# Patient Record
Sex: Female | Born: 1955 | Race: Black or African American | Hispanic: No | Marital: Single | Smoking: Never smoker
Health system: Southern US, Community
[De-identification: ages and names within clinical notes are randomized; demographics above are authoritative.]

## PROBLEM LIST (undated history)

## (undated) DIAGNOSIS — C50919 Malignant neoplasm of unspecified site of unspecified female breast: Secondary | ICD-10-CM

## (undated) HISTORY — PX: COLONOSCOPY: SHX5424

---

## 1996-04-17 HISTORY — PX: ABDOMINAL HYSTERECTOMY: SHX81

## 2016-12-14 ENCOUNTER — Other Ambulatory Visit: Payer: Self-pay | Admitting: Family Medicine

## 2016-12-14 DIAGNOSIS — N63 Unspecified lump in unspecified breast: Secondary | ICD-10-CM

## 2016-12-20 ENCOUNTER — Ambulatory Visit
Admission: RE | Admit: 2016-12-20 | Discharge: 2016-12-20 | Disposition: A | Discharge status: Home / Self Care | Payer: PRIVATE HEALTH INSURANCE | Source: Ambulatory Visit | Attending: Family Medicine | Admitting: Family Medicine

## 2016-12-20 ENCOUNTER — Other Ambulatory Visit: Payer: Self-pay | Admitting: Family Medicine

## 2016-12-20 DIAGNOSIS — N63 Unspecified lump in unspecified breast: Secondary | ICD-10-CM

## 2016-12-22 ENCOUNTER — Ambulatory Visit
Admission: RE | Admit: 2016-12-22 | Discharge: 2016-12-22 | Disposition: A | Discharge status: Home / Self Care | Payer: PRIVATE HEALTH INSURANCE | Source: Ambulatory Visit | Attending: Family Medicine | Admitting: Family Medicine

## 2016-12-22 ENCOUNTER — Other Ambulatory Visit: Payer: Self-pay | Admitting: Family Medicine

## 2016-12-22 DIAGNOSIS — N63 Unspecified lump in unspecified breast: Secondary | ICD-10-CM

## 2016-12-26 ENCOUNTER — Telehealth: Payer: Self-pay | Admitting: Hematology and Oncology

## 2016-12-26 ENCOUNTER — Encounter: Payer: Self-pay | Admitting: *Deleted

## 2016-12-26 NOTE — Telephone Encounter (Signed)
Confirmed with patient [name] appointment for 01/03/17 in the afternoon, patient wanted intake form to be mailed to her

## 2017-01-02 ENCOUNTER — Other Ambulatory Visit: Payer: Self-pay | Admitting: *Deleted

## 2017-01-02 ENCOUNTER — Encounter: Payer: Self-pay | Admitting: *Deleted

## 2017-01-02 DIAGNOSIS — Z170 Estrogen receptor positive status [ER+]: Principal | ICD-10-CM

## 2017-01-02 DIAGNOSIS — C50411 Malignant neoplasm of upper-outer quadrant of right female breast: Secondary | ICD-10-CM

## 2017-01-03 ENCOUNTER — Encounter: Payer: Self-pay | Admitting: General Practice

## 2017-01-03 ENCOUNTER — Encounter: Payer: Self-pay | Admitting: Radiation Oncology

## 2017-01-03 ENCOUNTER — Ambulatory Visit
Admission: RE | Admit: 2017-01-03 | Discharge: 2017-01-03 | Disposition: A | Discharge status: Home / Self Care | Payer: No Typology Code available for payment source | Source: Ambulatory Visit | Attending: Radiation Oncology | Admitting: Radiation Oncology

## 2017-01-03 ENCOUNTER — Ambulatory Visit: Payer: No Typology Code available for payment source | Attending: General Surgery | Admitting: Physical Therapy

## 2017-01-03 ENCOUNTER — Other Ambulatory Visit (HOSPITAL_BASED_OUTPATIENT_CLINIC_OR_DEPARTMENT_OTHER): Payer: PRIVATE HEALTH INSURANCE

## 2017-01-03 ENCOUNTER — Other Ambulatory Visit: Payer: Self-pay | Admitting: General Surgery

## 2017-01-03 ENCOUNTER — Encounter: Payer: Self-pay | Admitting: Hematology and Oncology

## 2017-01-03 ENCOUNTER — Ambulatory Visit (HOSPITAL_BASED_OUTPATIENT_CLINIC_OR_DEPARTMENT_OTHER): Payer: PRIVATE HEALTH INSURANCE | Admitting: Hematology and Oncology

## 2017-01-03 ENCOUNTER — Encounter: Payer: Self-pay | Admitting: Physical Therapy

## 2017-01-03 DIAGNOSIS — R293 Abnormal posture: Secondary | ICD-10-CM

## 2017-01-03 DIAGNOSIS — C50411 Malignant neoplasm of upper-outer quadrant of right female breast: Secondary | ICD-10-CM | POA: Insufficient documentation

## 2017-01-03 DIAGNOSIS — Z885 Allergy status to narcotic agent status: Secondary | ICD-10-CM | POA: Insufficient documentation

## 2017-01-03 DIAGNOSIS — Z170 Estrogen receptor positive status [ER+]: Principal | ICD-10-CM

## 2017-01-03 DIAGNOSIS — Z9889 Other specified postprocedural states: Secondary | ICD-10-CM | POA: Insufficient documentation

## 2017-01-03 DIAGNOSIS — Z90710 Acquired absence of both cervix and uterus: Secondary | ICD-10-CM | POA: Insufficient documentation

## 2017-01-03 DIAGNOSIS — Z510 Encounter for antineoplastic radiation therapy: Secondary | ICD-10-CM | POA: Insufficient documentation

## 2017-01-03 LAB — COMPREHENSIVE METABOLIC PANEL WITH GFR
ALT: 16 U/L (ref 0–55)
AST: 22 U/L (ref 5–34)
Albumin: 3.8 g/dL (ref 3.5–5.0)
Alkaline Phosphatase: 69 U/L (ref 40–150)
Anion Gap: 10 meq/L (ref 3–11)
BUN: 14.5 mg/dL (ref 7.0–26.0)
CO2: 23 meq/L (ref 22–29)
Calcium: 9.7 mg/dL (ref 8.4–10.4)
Chloride: 106 meq/L (ref 98–109)
Creatinine: 0.8 mg/dL (ref 0.6–1.1)
EGFR: >90 ml/min/1.73 m2
Glucose: 105 mg/dL (ref 70–140)
Potassium: 4.2 meq/L (ref 3.5–5.1)
Sodium: 139 meq/L (ref 136–145)
Total Bilirubin: 1.32 mg/dL — ABNORMAL HIGH (ref 0.20–1.20)
Total Protein: 7.3 g/dL (ref 6.4–8.3)

## 2017-01-03 LAB — CBC WITH DIFFERENTIAL/PLATELET
BASO%: 0.3 % (ref 0.0–2.0)
Basophils Absolute: 0 10e3/uL (ref 0.0–0.1)
EOS%: 9 % — ABNORMAL HIGH (ref 0.0–7.0)
Eosinophils Absolute: 0.4 10e3/uL (ref 0.0–0.5)
HCT: 40.6 % (ref 34.8–46.6)
HGB: 13.8 g/dL (ref 11.6–15.9)
LYMPH%: 48.2 % (ref 14.0–49.7)
MCH: 29.3 pg (ref 25.1–34.0)
MCHC: 34 g/dL (ref 31.5–36.0)
MCV: 86.2 fL (ref 79.5–101.0)
MONO#: 0.3 10e3/uL (ref 0.1–0.9)
MONO%: 6.7 % (ref 0.0–14.0)
NEUT#: 1.4 10e3/uL — ABNORMAL LOW (ref 1.5–6.5)
NEUT%: 35.8 % — ABNORMAL LOW (ref 38.4–76.8)
Platelets: 151 10e3/uL (ref 145–400)
RBC: 4.71 10e6/uL (ref 3.70–5.45)
RDW: 13.6 % (ref 11.2–14.5)
WBC: 3.9 10e3/uL (ref 3.9–10.3)
lymph#: 1.9 10e3/uL (ref 0.9–3.3)
nRBC: 0 % (ref 0–0)

## 2017-01-03 NOTE — Progress Notes (Signed)
Nutrition Assessment  Reason for Assessment:  Pt seen in Breast Clinic  ASSESSMENT:   61 year old female with new diagnosis breast cancer.  Past medical history reviewed.    Patient reports good appetite/normal.   Medications:  reviewed  Labs: reviewed  Anthropometrics:   Height: 66 inches Weight: 149 lb BMI: 28.2   NUTRITION DIAGNOSIS: Food and nutrition related knowledge deficit related to new diagnosis of breast cancer as evidenced by no prior need for nutrition related information.  INTERVENTION:   Discussed and provided packet of information regarding nutritional tips for breast cancer patients.  Questions answered.  Teachback method used.  Contact information provided and patient knows to contact me with questions/concerns.    MONITORING, EVALUATION, and GOAL: Pt will consume a healthy plant based diet to maintain lean body mass throughout treatment.   Aleksandra Raben B. Zenia Resides, [place], [place] Registered Dietitian [phone] (pager)

## 2017-01-03 NOTE — Therapy (Signed)
[place], [name], [address] Phone: [phone]   Fax:  [phone]  Physical Therapy Evaluation  Patient Details  Name: Brenda Hartman MRN: [id number] Date of Birth: 07/22/1955 Referring Provider: Dr. Stark Klein  Encounter Date: 01/03/2017      PT End of Session - 01/03/17 2040    Visit Number 1   Number of Visits 2   Date for PT Re-Evaluation --  Will f/u 3 weeks post op   PT Start Time 2376   PT Stop Time 1414   PT Time Calculation (min) 21 min   Activity Tolerance Patient tolerated treatment well   Behavior During Therapy [facility] for tasks assessed/performed      History reviewed. No pertinent past medical history.  Past Surgical History:  Procedure Laterality Date  . ABDOMINAL HYSTERECTOMY  1998   due to fibroids    There were no vitals filed for this visit.       Subjective Assessment - 01/03/17 2031    Subjective Patient reports she is here today to be seen by her medical team for her newly diagnosed right breast cancer.   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 It measures 2.8 cm and is located in the upper outer quadrant with a Ki67 of 10%. It is ER/PR positive and HER2 negative.   Patient Stated Goals Reduce lymphedema risk and learn post op shoulder ROM HEP   Currently in Pain? No/denies            [facility] PT Assessment - 01/03/17 0001      Assessment   Medical Diagnosis Right breast cancer   Referring Provider Dr. Stark Klein   Onset Date/Surgical Date 12/20/16   Hand Dominance Right   Prior Therapy none     Precautions   Precautions Other (comment)   Precaution Comments active cancer     Restrictions   Weight Bearing Restrictions No     Balance Screen   Has the patient fallen in the past 6 months No   Has the patient had a decrease in activity level because of a fear of falling?  No   Is  the patient reluctant to leave their home because of a fear of falling?  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 [place] She walks 30 min/day     Cognition   Overall Cognitive Status Within Functional Limits for tasks assessed     Posture/Postural Control   Posture/Postural Control Postural limitations   Postural Limitations Forward head;Rounded Shoulders     ROM / Strength   AROM / PROM / Strength AROM;Strength     AROM   AROM Assessment Site Shoulder;Cervical   Right/Left Shoulder Right;Left   Right Shoulder Extension 43 Degrees   Right Shoulder Flexion 146 Degrees   Right Shoulder ABduction 165 Degrees   Right Shoulder Internal Rotation 73 Degrees   Right Shoulder External Rotation 90 Degrees   Left Shoulder Extension 55 Degrees   Left Shoulder Flexion 154 Degrees   Left Shoulder ABduction 162 Degrees   Left Shoulder Internal Rotation 66 Degrees   Left Shoulder External Rotation 90  Degrees   Cervical Flexion WNL   Cervical Extension WNL   Cervical - Right Side Bend WNL   Cervical - Left Side Bend WNL   Cervical - Right Rotation WNL   Cervical - Left Rotation WNL     Strength   Overall Strength Within functional limits for tasks performed           LYMPHEDEMA/ONCOLOGY QUESTIONNAIRE - 01/03/17 2035      Type   Cancer Type Right breast cancer     Lymphedema Assessments   Lymphedema Assessments Upper extremities     Right Upper Extremity Lymphedema   10 cm Proximal to Olecranon Process 31.8 cm   Olecranon Process 25.3 cm   10 cm Proximal to Ulnar Styloid Process 21.9 cm   Just Proximal to Ulnar Styloid Process 16.2 cm   Across Hand at [employer] 19.6 cm   At [place] of 2nd Digit 6.1 cm      Left Upper Extremity Lymphedema   10 cm Proximal to Olecranon Process 31.8 cm   Olecranon Process 25.6 cm   10 cm Proximal to Ulnar Styloid Process 21.2 cm   Just Proximal to Ulnar Styloid Process 15.9 cm   Across Hand at [employer] 18.8 cm   At [place] of 2nd Digit 6 cm         Objective measurements completed on examination: See above findings.     Patient was instructed today in a home exercise program today for post op shoulder range of motion. These included active assist shoulder flexion in sitting, scapular retraction, wall walking with shoulder abduction, and hands behind head external rotation.  She was encouraged to do these twice a day, holding 3 seconds and repeating 5 times when permitted by her physician.         PT Education - 01/03/17 2039    Education provided Yes   Education Details Lymphedema risk reduction and post op shoulder ROM HEP   Person(s) Educated Patient   Methods Explanation;Demonstration;Handout   Comprehension Returned demonstration;Verbalized understanding              Breast Clinic Goals - 01/03/17 2044      Patient will be able to verbalize understanding of pertinent lymphedema risk reduction practices relevant to her diagnosis specifically related to skin care.   Time 1   Period Days   Status Achieved     Patient will be able to return demonstrate and/or verbalize understanding of the post-op home exercise program related to regaining shoulder range of motion.   Time 1   Period Days   Status Achieved     Patient will be able to verbalize understanding of the importance of attending the postoperative After Breast Cancer Class for further lymphedema risk reduction education and therapeutic exercise.   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 It measures  2.8 cm and is located in the upper outer quadrant with a Ki67 of 10%. It is ER/PR positive and HER2 negative. Her multidisciplinary medical team met prior to her assessments to determine a recommended treatment plan. She is planning to have a right lumpectomy and sentinel node biopsy followed by Oncotype testing, radiation, and anti-estrogen therapy. She may benefit from post op PT to regain shoulder  ROM and reduce lymphedema risk.   History and Personal Factors relevant to plan of care: Lives alone   Clinical Presentation Stable   Clinical Decision Making Low   Rehab Potential Excellent   Clinical Impairments Affecting Rehab Potential None   PT Frequency One time visit  Followed by a f/u visit 3 weeks post op   PT Treatment/Interventions Patient/family education;Therapeutic exercise   PT Next Visit Plan Will f.u 3 weeks post op to reassess and determine PT needs   PT Home Exercise Plan Post op shoulder ROM HEP   Consulted and Agree with Plan of Care Patient      Patient will benefit from skilled therapeutic intervention in order to improve the following deficits and impairments:  Postural dysfunction, Decreased knowledge of precautions, Pain, Impaired UE functional use, Decreased range of motion  Visit Diagnosis: Carcinoma of upper-outer quadrant of right breast in female, estrogen receptor positive ([place]) - Plan: PT plan of care cert/re-cert  Abnormal posture - Plan: PT plan of care cert/re-cert   Patient will follow up at outpatient cancer rehab if needed following surgery.  If the patient requires physical therapy at that time, a specific plan will be dictated and sent to the referring physician for approval. The patient was educated today on appropriate basic range of motion exercises to begin post operatively and the importance of attending the After Breast Cancer class following surgery.  Patient was educated today on lymphedema risk reduction practices as it pertains to recommendations  that will benefit the patient immediately following surgery.  She verbalized good understanding.  No additional physical therapy is indicated at this time.      Problem List Patient Active Problem List   Diagnosis Date Noted  . Malignant neoplasm of upper-outer quadrant of right breast in female, estrogen receptor positive ([place]) 01/02/2017    Annia Friendly, PT 01/03/17 8:47 PM  [place], [name], [address] Phone: [phone]   Fax:  [phone]  Name: Brenda Hartman MRN: [id number] Date of Birth: 10-27-1955

## 2017-01-03 NOTE — Addendum Note (Signed)
Encounter addended by: Hayden Pedro, PA-C on: 01/03/2017  2:20 PM<BR>    Actions taken: Sign clinical note

## 2017-01-03 NOTE — Progress Notes (Signed)
Radiation Oncology         ([phone]) [phone] ________________________________  Name: Brenda Hartman        MRN: [id number]  Date of Service: 01/03/2017 DOB: Jul 11, 1955  EY:CXKGY, Mechele Claude, MD  Stark Klein, MD     REFERRING PHYSICIAN: Stark Klein, MD   DIAGNOSIS: The encounter diagnosis was Malignant neoplasm of upper-outer quadrant of right breast in female, estrogen receptor positive ([place]).   HISTORY OF PRESENT ILLNESS: Brenda Hartman is a 61 y.o. female seen in the multidisciplinary breast clinic for a new diagnosis of a right  breast cancer. The patient was noted to have a palpable mass in the right breast. Diagnostic imaging revealed a 2.8 x 2.4 x 1.7 cm mass in the 9:30 position of the right breast, and her axilla was negative for adenopathy by ultrasound. A biopsy on 12/22/16 of her mass revealed a grade 1 ER/PR positive, HER2 negative invasive ductal carcinoma with DCIS and her Ki 67 was 10%. She comes today to discuss options of radiotherapy.   PREVIOUS RADIATION THERAPY: No   PAST MEDICAL HISTORY: No past medical history on file.     PAST SURGICAL HISTORY: Past Surgical History:  Procedure Laterality Date  . ABDOMINAL HYSTERECTOMY  1998   due to fibroids     FAMILY HISTORY:  Family History  Problem Relation Age of Onset  . Cancer Neg Hx      SOCIAL HISTORY:  reports that she has never smoked. She has never used smokeless tobacco. She reports that she does not drink alcohol or use drugs. The patient lives in [place]. She is single. 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   ALLERGIES: Morphine and related   MEDICATIONS:  No current outpatient prescriptions on file.   No current facility-administered medications for this encounter.      REVIEW OF SYSTEMS: On review of systems, the patient reports that she is doing well overall. She denies any chest pain, shortness of breath, cough, fevers, chills, night sweats, unintended weight changes. She denies  any bowel or bladder disturbances, and denies abdominal pain, nausea or vomiting. She denies any new musculoskeletal or joint aches or pains. A complete review of systems is obtained and is otherwise negative.     PHYSICAL EXAM:  Wt Readings from Last 3 Encounters:  01/03/17 181 lb 4.8 oz (82.2 kg)   Temp Readings from Last 3 Encounters:  01/03/17 97.7 F (36.5 C) (Oral)   BP Readings from Last 3 Encounters:  01/03/17 140/90   Pulse Readings from Last 3 Encounters:  01/03/17 70     In general this is a well appearing African American  female in no acute distress. She is alert and oriented x4 and appropriate throughout the examination. HEENT reveals that the patient is normocephalic, atraumatic. EOMs are intact. PERRLA. Skin is intact without any evidence of gross lesions. Cardiovascular exam reveals a regular rate and rhythm, no clicks rubs or murmurs are auscultated. Chest is clear to auscultation bilaterally. Lymphatic assessment is performed and does not reveal any adenopathy in the cervical, supraclavicular, axillary, or inguinal chains. Bilateral breast exam is performed and reveals fullness in the right breast without nipple discharge or bleeding. The left breast is negative for palpable mass.  Abdomen has active bowel sounds in all quadrants and is intact. The abdomen is soft, non tender, non distended. Lower extremities are negative for pretibial pitting edema, deep calf tenderness, cyanosis or clubbing.   ECOG = 1  0 - Asymptomatic (  Fully active, able to carry on all predisease activities without restriction)  1 - Symptomatic but completely ambulatory (Restricted in physically strenuous activity but ambulatory and able to carry out work of a light or sedentary nature. For example, light housework, office work)  2 - Symptomatic, <50% in bed during the day (Ambulatory and capable of all self care but unable to carry out any work activities. Up and about more than 50% of waking  hours)  3 - Symptomatic, >50% in bed, but not bedbound (Capable of only limited self-care, confined to bed or chair 50% or more of waking hours)  4 - Bedbound (Completely disabled. Cannot carry on any self-care. Totally confined to bed or chair)  5 - Death   Eustace Pen MM, Creech RH, Tormey DC, et al. [phone]). "Toxicity and response criteria of the [facility]". [place] Oncol. 5 (6): 649-55    LABORATORY DATA:  Lab Results  Component Value Date   WBC 3.9 01/03/2017   HGB 13.8 01/03/2017   HCT 40.6 01/03/2017   MCV 86.2 01/03/2017   PLT 151 01/03/2017   Lab Results  Component Value Date   NA 139 01/03/2017   K 4.2 01/03/2017   CO2 23 01/03/2017   Lab Results  Component Value Date   ALT 16 01/03/2017   AST 22 01/03/2017   ALKPHOS 69 01/03/2017   BILITOT 1.32 (H) 01/03/2017      RADIOGRAPHY: US Breast Ltd Uni Right Inc Axilla  Result Date: 12/22/2016 CLINICAL DATA:  Right breast upper outer quadrant mass. EXAM: ULTRASOUND OF THE RIGHT BREAST COMPARISON:  Previous exam(s). FINDINGS: Static images from targeted right breast ultrasound demonstrates 930 o'clock 4 cm from the nipple solid hypoechoic round mass with increased internal vascularity. It measures 2.4 x 1.7 x 2.8 cm. IMPRESSION: Suspicious right breast 930 o'clock mass. RECOMMENDATION: Ultrasound-guided core needle biopsy of the right breast. I have discussed the findings and recommendations with the patient. Results were also provided in writing at the conclusion of the visit. If applicable, a reminder letter will be sent to the patient regarding the next appointment. BI-RADS CATEGORY  4: Suspicious. Electronically Signed   By: Fidela Salisbury M.D.   On: 12/22/2016 11:23   Mm Diag Breast Tomo Bilateral  Result Date: 12/20/2016 CLINICAL DATA:  Patient complains of a palpable mass in the right breast. EXAM: 2D DIGITAL DIAGNOSTIC BILATERAL MAMMOGRAM WITH CAD AND ADJUNCT TOMO ULTRASOUND RIGHT BREAST  COMPARISON:  Previous exam(s). ACR Breast Density Category b: There are scattered areas of fibroglandular density. FINDINGS: There is a 2.6 cm mass in the upper-outer quadrant of the right breast. It is associated with punctate calcifications. No suspicious mass or malignant type microcalcifications identified in the left breast. Mammographic images were processed with CAD. On physical exam, I palpate a discrete mass in the right breast at 9:30 4 cm from the nipple. Targeted ultrasound is performed, showing a well-circumscribed hypoechoic mass in the right breast 9:30 4 cm from the nipple measuring 2.4 x 1.7 x 2.8 cm. Sonographic evaluation of the right axilla does not show any enlarged adenopathy. IMPRESSION: Suspicious right breast mass. RECOMMENDATION: Ultrasound-guided core biopsy of the right breast mass is recommended. The biopsy will be scheduled at the patient's convenience. I have discussed the findings and recommendations with the patient. Results were also provided in writing at the conclusion of the visit. If applicable, a reminder letter will be sent to the patient regarding the next appointment. BI-RADS CATEGORY  5: Highly  suggestive of malignancy. Electronically Signed   By: Lillia Mountain M.D.   On: 12/20/2016 15:14   Mm Clip Placement Right  Result Date: 12/22/2016 CLINICAL DATA:  Post ultrasound-guided core needle biopsy of right 930 o'clock breast mass. EXAM: DIAGNOSTIC RIGHT MAMMOGRAM POST ULTRASOUND BIOPSY COMPARISON:  Previous exam(s). FINDINGS: Mammographic images were obtained following ultra guided biopsy of right 930 o'clock breast mass. Two-view mammography demonstrates presence of a ribbon shaped marker within the biopsied mass. Expected post biopsy changes are seen. IMPRESSION: Successful placement of ribbon shaped marker, post ultrasound-guided core needle biopsy of the right breast. Final Assessment: Post Procedure Mammograms for Marker Placement Electronically Signed   By: Fidela Salisbury M.D.   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 This was found to be concordant by Dr. Fidela Salisbury. Pathology results were discussed with the patient by telephone. The patient reported doing well after the biopsy. Post biopsy instructions and care were reviewed and questions were answered. The patient was encouraged to call [facility] for any additional concerns. The patient was referred to the [facility] at the [facility] on January 03, 2017. Pathology results reported by Susa Raring RN, BSN on 12/25/2016. Electronically Signed   By: Fidela Salisbury M.D.   On: 12/25/2016 10:17   Result Date: 12/25/2016 CLINICAL DATA:  Right breast 930 o'clock mass. EXAM: ULTRASOUND GUIDED RIGHT BREAST CORE NEEDLE BIOPSY COMPARISON:  Previous exam(s). FINDINGS: I met with the patient and we discussed the procedure of ultrasound-guided biopsy, including benefits and alternatives. We discussed the high likelihood of a successful procedure. We discussed the risks of the procedure, including infection, bleeding, tissue injury, clip migration, and inadequate sampling. Informed written consent was given. The usual time-out protocol was performed immediately prior to the procedure. Lesion quadrant: Upper outer quadrant. Using sterile technique and 1% Lidocaine as local anesthetic, under direct ultrasound visualization, a 14 gauge spring-loaded device was used to perform biopsy of right breast 930 o'clock mass using a inferior approach. At the conclusion of the procedure a ribbon shaped tissue marker clip was deployed into the biopsy cavity. Follow up 2 view mammogram was performed and dictated separately. IMPRESSION:  Ultrasound guided biopsy of right breast. No apparent complications. Electronically Signed: By: Fidela Salisbury M.D. On: 12/22/2016 11:19       IMPRESSION/PLAN: 1. Stage IB, cT2N0Mx grade 1 ER/PR positive invasive ductal carcinoma of the right breast. Dr. Lisbeth Renshaw discusses the pathology findings and reviews the nature of invasive ductal  disease. The consensus from the breast conference included  breast conservation with lumpectomy with sentinel mapping or mastectomy with sentinel node evaluatoin. Oncotype testing will be performed to determine a role for chemotherapy. Provided that chemotherapy is not indicated, the patient's course would then be followed by external radiotherapy to the breast if she undergoes lumpectomy followed by antiestrogen therapy. We discussed the risks, benefits, short, and long term effects of radiotherapy, and the patient is interested in proceeding as she is leaning toward lumpectomy. Dr. Lisbeth Renshaw discusses the delivery and logistics of radiotherapy and would anticipate a 4 week course. We will see her back about 2 weeks postoperatively if she undergoes lumpectomy to move forward with the simulation and planning process and anticipate  starting radiotherapy about 4 weeks after surgery.    The above documentation reflects my direct findings during this shared patient visit. Please see the separate note by Dr. Lisbeth Renshaw on this date for the remainder of the patient's plan of care.    Carola Rhine, PAC

## 2017-01-03 NOTE — Patient Instructions (Signed)

## 2017-01-04 ENCOUNTER — Encounter: Payer: Self-pay | Admitting: Hematology and Oncology

## 2017-01-04 NOTE — Progress Notes (Signed)
[facility] Psychosocial Distress Screening Spiritual Care  Met with Jaima in [facility] to introduce [facility] team/resources, reviewing distress screen per protocol.  The patient scored a 1 on the Psychosocial Distress Thermometer which indicates mild distress. Also assessed for distress and other psychosocial needs.   ONCBCN DISTRESS SCREENING 01/04/2017  Screening Type Initial Screening  Distress experienced in past week (1-10) 1  Emotional problem type Nervousness/Anxiety  Physical Problem type Skin dry/itchy  Referral to support programs Yes    Per pt, Snow moved here from [place] two years ago for her own self-care and new adventure, and she loves it.  She has been hoping to retire in the next couple of years.  Prayer and faith help keep her calm and centered.  She reports meaningful connection with church.   Follow up needed: No.  At this time, pt has no other needs or concerns.  She has full packet of [place] and knows to contact Team with any needs, questions, or interests.   [place], [place], [facility] Pager [phone] Voicemail [phone]

## 2017-01-04 NOTE — Progress Notes (Signed)
Received staff message from Lisa(chaplain) to contact patient regarding insurnce/assistance.  Reviewed chart and no treatment plan present, surgery first.  Called patient to introduce myself as Arboriculturist. Advised her of my role and how we assist patients in chemotherapy. Asked patient if she would be receiving chemotherapy and she said no she is having surgery and her insurance isn't great. Advised patient that any assistance with surgery would have to be handled with the surgeon's office and the pre-service center who would call about surgery cost. Also explained if any assistance is available once the bill is sent, she would contact the billing department on the bill to discuss.  If chemotherapy becomes a part of her treatment plan, I would meet with her to discuss available options related to chemo treatment.

## 2017-01-04 NOTE — Progress Notes (Signed)
[facility] NOTE  Patient Care Team: Fanny Bien, MD as PCP - General (Family Medicine) Stark Klein, MD as Consulting Physician (General Surgery) Nicholas Lose, MD as Consulting Physician (Hematology and Oncology) Kyung Rudd, MD as Consulting Physician (Radiation Oncology)  CHIEF COMPLAINTS/PURPOSE OF CONSULTATION:  Newly diagnosed breast cancer  HISTORY OF PRESENTING ILLNESS:  Brenda Hartman 61 y.o. female is here because of recent diagnosis of right breast cancer. Patient felt a lump in the right breast in the upper outer quadrant at 9:30 position. Mammogram and ultrasound revealed a 2.8 cm mass. Axilla was negative. 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 The tumor was ER/PR positive HER-2 negative with a Ki-67 of 10%. She was presented this morning in the multidisciplinary tumor board and she is here today to discuss entire treatment plan.  I reviewed her records extensively and collaborated the history with the patient.  SUMMARY OF ONCOLOGIC HISTORY:   Malignant neoplasm of upper-outer quadrant of right breast in female, estrogen receptor positive ([place])   12/22/2016 Initial Diagnosis    Palpable right breast mass UOQ at 9:30 position measured 2.8 cm axilla negative, ultrasound biopsy showed grade 1 IDC with DCIS, ER 100%, PR 100%, Ki-67 10%, HER-2 negative ratio 1.06, T2 N0 stage IB AJCC 8       MEDICAL HISTORY:  History reviewed. No pertinent past medical history.  SURGICAL HISTORY: Past Surgical History:  Procedure Laterality Date  . ABDOMINAL HYSTERECTOMY  1998   due to fibroids    SOCIAL HISTORY: Social History   Social History  . Marital status: Single    Spouse name: N/A  . Number of children: N/A  . Years of education: N/A   Occupational History  . Not on file.   Social History Main Topics  . Smoking status: Never Smoker  . Smokeless tobacco: Never Used  . Alcohol use No  . Drug use:  No  . Sexual activity: Not on file   Other Topics Concern  . Not on file   Social History Narrative  . No narrative on file    FAMILY HISTORY: Family History  Problem Relation Age of Onset  . Cancer Neg Hx     ALLERGIES:  is allergic to morphine and related.  MEDICATIONS:  No current outpatient prescriptions on file.   No current facility-administered medications for this visit.     REVIEW OF SYSTEMS:   Constitutional: Denies fevers, chills or abnormal night sweats Eyes: Denies blurriness of vision, double vision or watery eyes Ears, nose, mouth, throat, and face: Denies mucositis or sore throat Respiratory: Denies cough, dyspnea or wheezes Cardiovascular: Denies palpitation, chest discomfort or lower extremity swelling Gastrointestinal:  Denies nausea, heartburn or change in bowel habits Skin: Denies abnormal skin rashes Lymphatics: Denies new lymphadenopathy or easy bruising Neurological:Denies numbness, tingling or new weaknesses Behavioral/Psych: Mood is stable, no new changes  Breast:  Palpable lump in the right breast All other systems were reviewed with the patient and are negative.  PHYSICAL EXAMINATION: ECOG PERFORMANCE STATUS: 1 - Symptomatic but completely ambulatory  Vitals:   01/03/17 1248  BP: 140/90  Pulse: 70  Resp: 18  Temp: 97.7 F (36.5 C)  SpO2: 99%   Filed Weights   01/03/17 1248  Weight: 181 lb 4.8 oz (82.2 kg)    GENERAL:alert, no distress and comfortable SKIN: skin color, texture, turgor are normal, no rashes or significant lesions EYES: normal, conjunctiva are pink and non-injected, sclera clear OROPHARYNX:no exudate,  no erythema and lips, buccal mucosa, and tongue normal  NECK: supple, thyroid normal size, non-tender, without nodularity LYMPH:  no palpable lymphadenopathy in the cervical, axillary or inguinal LUNGS: clear to auscultation and percussion with normal breathing effort HEART: regular rate & rhythm and no murmurs and  no lower extremity edema ABDOMEN:abdomen soft, non-tender and normal bowel sounds Musculoskeletal:no cyanosis of digits and no clubbing  PSYCH: alert & oriented x 3 with fluent speech NEURO: no focal motor/sensory deficits BREAST: Palpable lump in the right breast. No palpable axillary or supraclavicular lymphadenopathy (exam performed in the presence of a chaperone)   LABORATORY DATA:  I have reviewed the data as listed Lab Results  Component Value Date   WBC 3.9 01/03/2017   HGB 13.8 01/03/2017   HCT 40.6 01/03/2017   MCV 86.2 01/03/2017   PLT 151 01/03/2017   Lab Results  Component Value Date   NA 139 01/03/2017   K 4.2 01/03/2017   CO2 23 01/03/2017    RADIOGRAPHIC STUDIES: I have personally reviewed the radiological reports and agreed with the findings in the report.  ASSESSMENT AND PLAN:  Malignant neoplasm of upper-outer quadrant of right breast in female, estrogen receptor positive ([place]) 12/22/2016: Palpable right breast mass UOQ at 9:30 position measured 2.8 cm axilla negative, ultrasound biopsy showed grade 1 IDC with DCIS, ER 100%, PR 100%, Ki-67 10%, HER-2 negative ratio 1.06, T2 N0 stage IB AJCC 8  Pathology and radiology counseling:Discussed with the patient, the details of pathology including the type of breast cancer,the clinical staging, the significance of ER, PR and HER-2/neu receptors and the implications for treatment. After reviewing the pathology in detail, we proceeded to discuss the different treatment options between surgery, radiation, chemotherapy, antiestrogen therapies.  Recommendations: 1. Breast conserving surgery with SLN biopsy followed by 2. Oncotype DX testing to determine if chemotherapy would be of any benefit followed by 3. Adjuvant radiation therapy followed by 4. Adjuvant antiestrogen therapy  Oncotype counseling: I discussed Oncotype DX test. I explained to the patient that this is a 21 gene panel to evaluate patient tumors DNA to  calculate recurrence score. This would help determine whether patient has high risk or intermediate risk or low risk breast cancer. She understands that if her tumor was found to be high risk, she would benefit from systemic chemotherapy. If low risk, no need of chemotherapy. If she was found to be intermediate risk, we would need to evaluate the score as well as other risk factors and determine if an abbreviated chemotherapy may be of benefit.  Return to clinic after surgery to discuss final pathology report and then determine if Oncotype DX testing will need to be sent.   All questions were answered. The patient knows to call the clinic with any problems, questions or concerns.    Rulon Eisenmenger, MD 01/04/17

## 2017-01-04 NOTE — Assessment & Plan Note (Signed)
12/22/2016: Palpable right breast mass UOQ at 9:30 position measured 2.8 cm axilla negative, ultrasound biopsy showed grade 1 IDC with DCIS, ER 100%, PR 100%, Ki-67 10%, HER-2 negative ratio 1.06, T2 N0 stage IB AJCC 8  Pathology and radiology counseling:Discussed with the patient, the details of pathology including the type of breast cancer,the clinical staging, the significance of ER, PR and HER-2/neu receptors and the implications for treatment. After reviewing the pathology in detail, we proceeded to discuss the different treatment options between surgery, radiation, chemotherapy, antiestrogen therapies.  Recommendations: 1. Breast conserving surgery with SLN biopsy followed by 2. Oncotype DX testing to determine if chemotherapy would be of any benefit followed by 3. Adjuvant radiation therapy followed by 4. Adjuvant antiestrogen therapy  Oncotype counseling: I discussed Oncotype DX test. I explained to the patient that this is a 21 gene panel to evaluate patient tumors DNA to calculate recurrence score. This would help determine whether patient has high risk or intermediate risk or low risk breast cancer. She understands that if her tumor was found to be high risk, she would benefit from systemic chemotherapy. If low risk, no need of chemotherapy. If she was found to be intermediate risk, we would need to evaluate the score as well as other risk factors and determine if an abbreviated chemotherapy may be of benefit.  Return to clinic after surgery to discuss final pathology report and then determine if Oncotype DX testing will need to be sent.

## 2017-01-05 ENCOUNTER — Other Ambulatory Visit: Payer: Self-pay | Admitting: General Surgery

## 2017-01-05 DIAGNOSIS — C50411 Malignant neoplasm of upper-outer quadrant of right female breast: Secondary | ICD-10-CM

## 2017-01-05 DIAGNOSIS — Z170 Estrogen receptor positive status [ER+]: Principal | ICD-10-CM

## 2017-01-09 ENCOUNTER — Telehealth: Payer: Self-pay | Admitting: *Deleted

## 2017-01-09 NOTE — Telephone Encounter (Signed)
Called pt to discuss [place] from 9.19.18. Unable to leave msg will call back.

## 2017-01-10 ENCOUNTER — Telehealth: Payer: Self-pay | Admitting: Hematology and Oncology

## 2017-01-10 NOTE — Telephone Encounter (Signed)
Scheduled appts per 9/25 sch msg. Left voicemail for the patient regarding the appts and am sending her a confirmation letter in the mail .

## 2017-01-16 ENCOUNTER — Encounter (HOSPITAL_COMMUNITY)
Admission: RE | Admit: 2017-01-16 | Discharge: 2017-01-16 | Disposition: A | Discharge status: Home / Self Care | Payer: No Typology Code available for payment source | Source: Ambulatory Visit | Attending: General Surgery | Admitting: General Surgery

## 2017-01-16 ENCOUNTER — Encounter (HOSPITAL_COMMUNITY): Payer: Self-pay | Admitting: *Deleted

## 2017-01-16 DIAGNOSIS — Z01818 Encounter for other preprocedural examination: Secondary | ICD-10-CM | POA: Diagnosis not present

## 2017-01-16 LAB — CBC
HEMATOCRIT: 41.2 % (ref 36.0–46.0)
HEMOGLOBIN: 13.9 g/dL (ref 12.0–15.0)
MCH: 28.5 pg (ref 26.0–34.0)
MCHC: 33.7 g/dL (ref 30.0–36.0)
MCV: 84.6 fL (ref 78.0–100.0)
Platelets: 136 10*3/uL — ABNORMAL LOW (ref 150–400)
RBC: 4.87 MIL/uL (ref 3.87–5.11)
RDW: 13.5 % (ref 11.5–15.5)
WBC: 5.2 10*3/uL (ref 4.0–10.5)

## 2017-01-16 NOTE — Progress Notes (Signed)
PCP is Dr. Rachell Cipro Denies ever seeing a cardiologist. Denies any cough, fever, or chest pain. States she will have her seed placed Friday 01-19-17 Denies ever having a card cath, stress test, echo.

## 2017-01-16 NOTE — Pre-Procedure Instructions (Signed)
Brenda Hartman  01/16/2017      [facility] [place], [place] AT [facility] [address] [address] [name] [address] Phone: [phone] Fax: [phone]    Your procedure is scheduled on Oct 9  Report to [place] at 1000 A.M.  Call this number if you have problems the morning of surgery:  [phone]   Remember:  Do not eat food or drink liquids after midnight.  Take these medicines the morning of surgery with A SIP OF WATER na  Stop taking aspirin, BC's, Goody's, Herbal medications, Fish Oil, Ibuprofen, Advil, Motrin, Aleve, vitamins    Do not wear jewelry, make-up or nail polish.  Do not wear lotions, powders, or perfumes, or deoderant.  Do not shave 48 hours prior to surgery.  Men may shave face and neck.  Do not bring valuables to the hospital.  [facility] is not responsible for any belongings or valuables.  Contacts, dentures or bridgework may not be worn into surgery.  Leave your suitcase in the car.  After surgery it may be brought to your room.  For patients admitted to the hospital, discharge time will be determined by your treatment team.  Patients discharged the day of surgery will not be allowed to drive home.    Special instructions:  [place] - Preparing for Surgery  Before surgery, you can play an important role.  Because skin is not sterile, your skin needs to be as free of germs as possible.  You can reduce the number of germs on you skin by washing with CHG (chlorahexidine gluconate) soap before surgery.  CHG is an antiseptic cleaner which kills germs and bonds with the skin to continue killing germs even after washing.  Please DO NOT use if you have an allergy to CHG or antibacterial soaps.  If your skin becomes reddened/irritated stop using the CHG and inform your nurse when you arrive at Short Stay.  Do not shave (including legs and underarms) for at least 48 hours  prior to the first CHG shower.  You may shave your face.  Please follow these instructions carefully:   1.  Shower with CHG Soap the night before surgery and the                                morning of Surgery.  2.  If you choose to wash your hair, wash your hair first as usual with your       normal shampoo.  3.  After you shampoo, rinse your hair and body thoroughly to remove the                      Shampoo.  4.  Use CHG as you would any other liquid soap.  You can apply chg directly       to the skin and wash gently with scrungie or a clean washcloth.  5.  Apply the CHG Soap to your body ONLY FROM THE NECK DOWN.        Do not use on open wounds or open sores.  Avoid contact with your eyes,       ears, mouth and genitals (private parts).  Wash genitals (private parts)       with your normal soap.  6.  Wash thoroughly, paying special attention to the area where  your surgery        will be performed.  7.  Thoroughly rinse your body with warm water from the neck down.  8.  DO NOT shower/wash with your normal soap after using and rinsing off       the CHG Soap.  9.  Pat yourself dry with a clean towel.            10.  Wear clean pajamas.            11.  Place clean sheets on your bed the night of your first shower and do not        sleep with pets.  Day of Surgery  Do not apply any lotions/deoderants the morning of surgery.  Please wear clean clothes to the hospital/surgery center.     Please read over the following fact sheets that you were given. Pain Booklet, Coughing and Deep Breathing and Surgical Site Infection Prevention

## 2017-01-17 NOTE — H&P (Signed)
Brenda Hartman 01/03/2017 7:13 AM Location: [facility] Patient #: [id number] DOB: 04-26-1955 Undefined / Language: Cleophus Molt / Race: Black or African American Female   History of Present Illness Stark Klein MD; 01/03/2017 3:49 PM) The patient is a 61 year old female who presents with breast cancer. Pt is a 61 yo F who presented with a palpable right breast mass several weeks ago. She is referred for consultation regarding this diagnosis from Dr. Ernie Hew. Dx imaging was performed and showed a 2.8 cm mass in the UOQ at 9:30. Ultrasound of the right axilla was negative. 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 This was ER/PR + and Her 2 negative.   Menarche was age 10. She is a G1P1 wtih first child born below age 27. She has had a hysterectomy and BSO for severe fibroid tumors with chronic anemia and pelvic pain.   dx mammo/us 12/20/2016 ACR Breast Density Category b: There are scattered areas of fibroglandular density.  FINDINGS: There is a 2.6 cm mass in the upper-outer quadrant of the right breast. It is associated with punctate calcifications.  No suspicious mass or malignant type microcalcifications identified in the left breast.  Mammographic images were processed with CAD.  On physical exam, I palpate a discrete mass in the right breast at 9:30 4 cm from the nipple.  Targeted ultrasound is performed, showing a well-circumscribed hypoechoic mass in the right breast 9:30 4 cm from the nipple measuring 2.4 x 1.7 x 2.8 cm. Sonographic evaluation of the right axilla does not show any enlarged adenopathy.  IMPRESSION: Suspicious right breast mass.  RECOMMENDATION: Ultrasound-guided core biopsy of the right breast mass is recommended. The biopsy will be scheduled at the patient's convenience.  I have discussed the findings and recommendations with the patient. Results were also provided in writing at the conclusion of  the visit. If applicable, a reminder letter will be sent to the patient regarding the next appointment.  BI-RADS CATEGORY 5: Highly suggestive of malignancy.  Pathology 12/22/16 Diagnosis Breast, right, needle core biopsy, 9:30 o'clock - INVASIVE DUCTAL CARCINOMA, SEE COMMENT. - DUCTAL CARCINOMA IN SITU. Microscopic Comment The carcinoma appears grade 1. +/+/-   CBC and CMET essentially normal except for T bili 1.32    Past Surgical History Stark Klein, MD; 01/03/2017 7:13 AM) Hysterectomy (due to cancer) - Complete  Oral Surgery  Resection of Small Bowel   Diagnostic Studies History Stark Klein, MD; 01/03/2017 7:13 AM) Colonoscopy  5-10 years ago Mammogram  1-3 years ago Pap Smear  1-5 years ago  Social History Stark Klein, MD; 01/03/2017 7:13 AM) Alcohol use  Moderate alcohol use. Caffeine use  Coffee. No drug use  Tobacco use  Former smoker.  Family History Stark Klein, MD; 01/03/2017 7:13 AM) Diabetes Mellitus  Mother. Hypertension  Mother.  Pregnancy / Birth History Stark Klein, MD; 01/03/2017 7:13 AM) Age at menarche  67 years. Age of menopause  <45 Gravida  1 Irregular periods  Maternal age  <69 Para  93  Other Problems Stark Klein, MD; 01/03/2017 7:13 AM) Lump In Breast  Oophorectomy  Bilateral. Other disease, cancer, significant illness     Review of Systems Stark Klein MD; 01/03/2017 7:13 AM) General Not Present- Appetite Loss, Chills, Fatigue, Fever, Night Sweats, Weight Gain and Weight Loss. Skin Not Present- Change in Wart/Mole, Dryness, Hives, Jaundice, New Lesions, Non-Healing Wounds, Rash and Ulcer. HEENT Present- Seasonal Allergies and Wears glasses/contact lenses. Not Present- Earache, Hearing  Loss, Hoarseness, Nose Bleed, Oral Ulcers, Ringing in the Ears, Sinus Pain, Sore Throat, Visual Disturbances and Yellow Eyes. Respiratory Not Present- Bloody sputum, Chronic Cough, Difficulty Breathing, Snoring and  Wheezing. Breast Present- Breast Mass. Not Present- Breast Pain, Nipple Discharge and Skin Changes. Cardiovascular Present- Swelling of Extremities. Not Present- Chest Pain, Difficulty Breathing Lying Down, Leg Cramps, Palpitations, Rapid Heart Rate and Shortness of Breath. Gastrointestinal Not Present- Abdominal Pain, Bloating, Bloody Stool, Change in Bowel Habits, Chronic diarrhea, Constipation, Difficulty Swallowing, Excessive gas, Gets full quickly at meals, Hemorrhoids, Indigestion, Nausea, Rectal Pain and Vomiting. Female Genitourinary Not Present- Frequency, Nocturia, Painful Urination, Pelvic Pain and Urgency. Musculoskeletal Not Present- Back Pain, Joint Pain, Joint Stiffness, Muscle Pain, Muscle Weakness and Swelling of Extremities. Neurological Not Present- Decreased Memory, Fainting, Headaches, Numbness, Seizures, Tingling, Tremor, Trouble walking and Weakness. Psychiatric Not Present- Anxiety, Bipolar, Change in Sleep Pattern, Depression, Fearful and Frequent crying. Endocrine Present- Hot flashes. Not Present- Cold Intolerance, Excessive Hunger, Hair Changes, Heat Intolerance and New Diabetes. Hematology Not Present- Blood Thinners, Easy Bruising, Excessive bleeding, Gland problems, HIV and Persistent Infections.  Vitals Stark Klein MD; 01/03/2017 5:02 PM) 01/03/2017 5:01 PM Weight: 181.3 lb Height: 66in Body Surface Area: 1.92 m Body Mass Index: 29.26 kg/m  Temp.: 97.29F  Pulse: 70 (Regular)  Resp.: 18 (Unlabored)  BP: 140/90 (Sitting, Left Arm, Standard)       Physical Exam Stark Klein MD; 01/03/2017 3:51 PM) General Mental Status-Alert. General Appearance-Consistent with stated age. Hydration-Well hydrated. Voice-Normal.  Head and Neck Head-normocephalic, atraumatic with no lesions or palpable masses. Note: lipoma left neck.   Eye Sclera/Conjunctiva - Bilateral-No scleral icterus.  Chest and Lung Exam Chest and lung exam reveals  -quiet, even and easy respiratory effort with no use of accessory muscles. Inspection Chest Wall - Normal. Back - normal.  Breast Note: breasts relatively symmetric bilaterally. moderate ptosis. 3 cm palpable mass at 9 o'clock. mobile. no nipple retraction or nipple discharge. No LAD. no skin dimpling. left side normal.   Cardiovascular Cardiovascular examination reveals -normal pedal pulses bilaterally. Note: regular rate and rhythm  Abdomen Inspection-Inspection Normal. Palpation/Percussion Palpation and Percussion of the abdomen reveal - Soft, Non Tender, No Rebound tenderness, No Rigidity (guarding) and No hepatosplenomegaly.  Peripheral Vascular Upper Extremity Inspection - Bilateral - Normal - No Clubbing, No Cyanosis, No Edema, Pulses Intact. Lower Extremity Palpation - Edema - Bilateral - No edema.  Neurologic Neurologic evaluation reveals -alert and oriented x 3 with no impairment of recent or remote memory. Mental Status-Normal.  Musculoskeletal Global Assessment -Note: no gross deformities.  Normal Exam - Left-Upper Extremity Strength Normal and Lower Extremity Strength Normal. Normal Exam - Right-Upper Extremity Strength Normal and Lower Extremity Strength Normal.  Lymphatic Head & Neck  General Head & Neck Lymphatics: Bilateral - Description - Normal. Axillary  General Axillary Region: Bilateral - Description - Normal. Tenderness - Non Tender.    Assessment & Plan Stark Klein MD; 01/03/2017 5:01 PM) PRIMARY CANCER OF UPPER OUTER QUADRANT OF RIGHT FEMALE BREAST (C50.411) Impression: Pt has a new diagnosis of cT2N0 right breast cancer. This appears low grade, but is a larger tumor. Will plan lumpectomy and sentinel lymph node biopsy. This will be followed by oncotype or mammaprint, depending on whether or not nodes are positive. If low risk, will then proceed to XRT, and later antiestrogen tx.  The surgical procedure was described to the  patient. I discussed the incision type and location and that we would need radiology involved on  with a wire or seed marker and/or sentinel node.  The risks and benefits of the procedure were described to the patient and she wishes to proceed.  We discussed the risks bleeding, infection, damage to other structures, need for further procedures/surgeries. We discussed the risk of seroma. The patient was advised if the area in the breast in cancer, we may need to go back to surgery for additional tissue to obtain negative margins or for a lymph node biopsy. The patient was advised that these are the most common complications, but that others can occur as well. They were advised against taking aspirin or other anti-inflammatory agents/blood thinners the week before surgery.  She would like to proceed with surgery soon. She will have family coming to help her. Current Plans You are being scheduled for surgery- Our schedulers will call you.  You should hear from our office's scheduling department within 5 working days about the location, date, and time of surgery. We try to make accommodations for patient's preferences in scheduling surgery, but sometimes the OR schedule or the surgeon's schedule prevents [place] from making those accommodations.  If you have not heard from our office [phone]) in 5 working days, call the office and ask for your surgeon's nurse.  If you have other questions about your diagnosis, plan, or surgery, call the office and ask for your surgeon's nurse.  Pt Education - [facility] Breast Cancer Information Given - [facility] "Breast Journey" Package Pt Education - flb breast cancer surgery: discussed with patient and provided information.   Signed by Stark Klein, MD (01/03/2017 5:14 PM)

## 2017-01-19 ENCOUNTER — Ambulatory Visit
Admission: RE | Admit: 2017-01-19 | Discharge: 2017-01-19 | Disposition: A | Discharge status: Home / Self Care | Payer: PRIVATE HEALTH INSURANCE | Source: Ambulatory Visit | Attending: General Surgery | Admitting: General Surgery

## 2017-01-19 DIAGNOSIS — C50411 Malignant neoplasm of upper-outer quadrant of right female breast: Secondary | ICD-10-CM

## 2017-01-19 DIAGNOSIS — Z170 Estrogen receptor positive status [ER+]: Principal | ICD-10-CM

## 2017-01-23 ENCOUNTER — Ambulatory Visit
Admission: RE | Admit: 2017-01-23 | Discharge: 2017-01-23 | Disposition: A | Discharge status: Home / Self Care | Payer: PRIVATE HEALTH INSURANCE | Source: Ambulatory Visit | Attending: General Surgery | Admitting: General Surgery

## 2017-01-23 ENCOUNTER — Encounter (HOSPITAL_COMMUNITY): Payer: Self-pay | Admitting: *Deleted

## 2017-01-23 ENCOUNTER — Ambulatory Visit (HOSPITAL_COMMUNITY)
Admission: RE | Admit: 2017-01-23 | Discharge: 2017-01-23 | Disposition: A | Discharge status: Home / Self Care | Payer: No Typology Code available for payment source | Source: Ambulatory Visit | Attending: General Surgery | Admitting: General Surgery

## 2017-01-23 ENCOUNTER — Ambulatory Visit (HOSPITAL_COMMUNITY): Payer: No Typology Code available for payment source | Admitting: Anesthesiology

## 2017-01-23 ENCOUNTER — Other Ambulatory Visit (HOSPITAL_COMMUNITY): Payer: Self-pay | Admitting: *Deleted

## 2017-01-23 ENCOUNTER — Encounter (HOSPITAL_COMMUNITY)
Admission: RE | Admit: 2017-01-23 | Discharge: 2017-01-23 | Disposition: A | Discharge status: Home / Self Care | Payer: No Typology Code available for payment source | Source: Ambulatory Visit | Attending: General Surgery | Admitting: General Surgery

## 2017-01-23 ENCOUNTER — Encounter (HOSPITAL_COMMUNITY)
Admission: RE | Disposition: A | Discharge status: Home / Self Care | Payer: Self-pay | Source: Ambulatory Visit | Attending: General Surgery

## 2017-01-23 DIAGNOSIS — Z90710 Acquired absence of both cervix and uterus: Secondary | ICD-10-CM | POA: Insufficient documentation

## 2017-01-23 DIAGNOSIS — Z170 Estrogen receptor positive status [ER+]: Principal | ICD-10-CM

## 2017-01-23 DIAGNOSIS — C50411 Malignant neoplasm of upper-outer quadrant of right female breast: Secondary | ICD-10-CM

## 2017-01-23 DIAGNOSIS — N641 Fat necrosis of breast: Secondary | ICD-10-CM | POA: Diagnosis not present

## 2017-01-23 DIAGNOSIS — Z87891 Personal history of nicotine dependence: Secondary | ICD-10-CM | POA: Diagnosis not present

## 2017-01-23 DIAGNOSIS — Z885 Allergy status to narcotic agent status: Secondary | ICD-10-CM | POA: Insufficient documentation

## 2017-01-23 HISTORY — PX: BREAST LUMPECTOMY WITH RADIOACTIVE SEED AND SENTINEL LYMPH NODE BIOPSY: SHX6550

## 2017-01-23 SURGERY — BREAST LUMPECTOMY WITH RADIOACTIVE SEED AND SENTINEL LYMPH NODE BIOPSY
Anesthesia: General | Site: Breast | Laterality: Right

## 2017-01-23 MED ORDER — MEPERIDINE HCL 25 MG/ML IJ SOLN: 6.2500 mg | INTRAMUSCULAR | Status: DC | PRN

## 2017-01-23 MED ORDER — BUPIVACAINE-EPINEPHRINE (PF) 0.25% -1:200000 IJ SOLN
INTRAMUSCULAR | Status: AC
  Filled 2017-01-23: qty 30

## 2017-01-23 MED ORDER — FENTANYL CITRATE (PF) 100 MCG/2ML IJ SOLN
INTRAMUSCULAR | Status: DC | PRN
  Administered 2017-01-23: 50 ug via INTRAVENOUS
  Administered 2017-01-23 (×2): 25 ug via INTRAVENOUS

## 2017-01-23 MED ORDER — FENTANYL CITRATE (PF) 100 MCG/2ML IJ SOLN
INTRAMUSCULAR | Status: AC
  Administered 2017-01-23: 100 ug via INTRAVENOUS
  Filled 2017-01-23: qty 2

## 2017-01-23 MED ORDER — CEFAZOLIN SODIUM-DEXTROSE 2-4 GM/100ML-% IV SOLN
2.0000 g | INTRAVENOUS | Status: AC
  Administered 2017-01-23: 2 g via INTRAVENOUS
  Filled 2017-01-23: qty 100

## 2017-01-23 MED ORDER — CHLORHEXIDINE GLUCONATE CLOTH 2 % EX PADS: 6.0000 | MEDICATED_PAD | Freq: Once | CUTANEOUS | Status: DC

## 2017-01-23 MED ORDER — PHENYLEPHRINE 40 MCG/ML (10ML) SYRINGE FOR IV PUSH (FOR BLOOD PRESSURE SUPPORT)
PREFILLED_SYRINGE | INTRAVENOUS | Status: AC
  Filled 2017-01-23: qty 10

## 2017-01-23 MED ORDER — MIDAZOLAM HCL 5 MG/5ML IJ SOLN
INTRAMUSCULAR | Status: DC | PRN
  Administered 2017-01-23: 2 mg via INTRAVENOUS

## 2017-01-23 MED ORDER — ONDANSETRON HCL 4 MG/2ML IJ SOLN
INTRAMUSCULAR | Status: AC
  Filled 2017-01-23: qty 2

## 2017-01-23 MED ORDER — ACETAMINOPHEN 325 MG PO TABS
650.0000 mg | ORAL_TABLET | ORAL | Status: DC | PRN
Start: 2017-01-23 — End: 2017-01-23

## 2017-01-23 MED ORDER — MIDAZOLAM HCL 2 MG/2ML IJ SOLN
INTRAMUSCULAR | Status: AC
  Filled 2017-01-23: qty 2

## 2017-01-23 MED ORDER — ONDANSETRON HCL 4 MG/2ML IJ SOLN: 4.0000 mg | Freq: Once | INTRAMUSCULAR | Status: DC | PRN

## 2017-01-23 MED ORDER — GLYCOPYRROLATE 0.2 MG/ML IJ SOLN
INTRAMUSCULAR | Status: DC | PRN
  Administered 2017-01-23: 0.2 mg via INTRAVENOUS

## 2017-01-23 MED ORDER — FENTANYL CITRATE (PF) 100 MCG/2ML IJ SOLN
25.0000 ug | INTRAMUSCULAR | Status: DC | PRN
  Administered 2017-01-23 (×3): 25 ug via INTRAVENOUS

## 2017-01-23 MED ORDER — OXYCODONE HCL 5 MG PO TABS: 5.0000 mg | ORAL_TABLET | Freq: Four times a day (QID) | ORAL | 0 refills | Status: DC | PRN

## 2017-01-23 MED ORDER — PROPOFOL 10 MG/ML IV BOLUS
INTRAVENOUS | Status: DC | PRN
  Administered 2017-01-23: 180 mg via INTRAVENOUS
  Administered 2017-01-23: 20 mg via INTRAVENOUS

## 2017-01-23 MED ORDER — DEXAMETHASONE SODIUM PHOSPHATE 10 MG/ML IJ SOLN
INTRAMUSCULAR | Status: DC | PRN
  Administered 2017-01-23: 10 mg via INTRAVENOUS

## 2017-01-23 MED ORDER — TECHNETIUM TC 99M SULFUR COLLOID FILTERED
1.0000 | Freq: Once | INTRAVENOUS | Status: AC | PRN
  Administered 2017-01-23: 1 via INTRADERMAL

## 2017-01-23 MED ORDER — FENTANYL CITRATE (PF) 100 MCG/2ML IJ SOLN
INTRAMUSCULAR | Status: AC
  Filled 2017-01-23: qty 2

## 2017-01-23 MED ORDER — 0.9 % SODIUM CHLORIDE (POUR BTL) OPTIME
TOPICAL | Status: DC | PRN
  Administered 2017-01-23: 1000 mL

## 2017-01-23 MED ORDER — SODIUM CHLORIDE 0.9 % IJ SOLN
INTRAMUSCULAR | Status: DC | PRN
  Administered 2017-01-23: 13:00:00 via INTRAMUSCULAR

## 2017-01-23 MED ORDER — SODIUM CHLORIDE 0.9 % IV SOLN: 250.0000 mL | INTRAVENOUS | Status: DC | PRN

## 2017-01-23 MED ORDER — BUPIVACAINE-EPINEPHRINE (PF) 0.25% -1:200000 IJ SOLN
INTRAMUSCULAR | Status: DC | PRN
  Administered 2017-01-23: 12:00:00 via INTRAMUSCULAR

## 2017-01-23 MED ORDER — SODIUM CHLORIDE 0.9% FLUSH: 3.0000 mL | Freq: Two times a day (BID) | INTRAVENOUS | Status: DC

## 2017-01-23 MED ORDER — FENTANYL CITRATE (PF) 100 MCG/2ML IJ SOLN
100.0000 ug | Freq: Once | INTRAMUSCULAR | Status: AC
Start: 2017-01-23 — End: 2017-01-23
  Administered 2017-01-23: 100 ug via INTRAVENOUS

## 2017-01-23 MED ORDER — ACETAMINOPHEN 650 MG RE SUPP: 650.0000 mg | RECTAL | Status: DC | PRN

## 2017-01-23 MED ORDER — FENTANYL CITRATE (PF) 250 MCG/5ML IJ SOLN
INTRAMUSCULAR | Status: AC
  Filled 2017-01-23: qty 5

## 2017-01-23 MED ORDER — MIDAZOLAM HCL 2 MG/2ML IJ SOLN
INTRAMUSCULAR | Status: AC
  Administered 2017-01-23: 2 mg via INTRAVENOUS
  Filled 2017-01-23: qty 2

## 2017-01-23 MED ORDER — PHENYLEPHRINE 40 MCG/ML (10ML) SYRINGE FOR IV PUSH (FOR BLOOD PRESSURE SUPPORT)
PREFILLED_SYRINGE | INTRAVENOUS | Status: DC | PRN
  Administered 2017-01-23 (×5): 80 ug via INTRAVENOUS

## 2017-01-23 MED ORDER — EPHEDRINE 5 MG/ML INJ
INTRAVENOUS | Status: AC
  Filled 2017-01-23: qty 10

## 2017-01-23 MED ORDER — LIDOCAINE 2% (20 MG/ML) 5 ML SYRINGE
INTRAMUSCULAR | Status: DC | PRN
  Administered 2017-01-23: 80 mg via INTRAVENOUS

## 2017-01-23 MED ORDER — ONDANSETRON HCL 4 MG/2ML IJ SOLN
INTRAMUSCULAR | Status: DC | PRN
  Administered 2017-01-23: 4 mg via INTRAVENOUS

## 2017-01-23 MED ORDER — MIDAZOLAM HCL 2 MG/2ML IJ SOLN
2.0000 mg | Freq: Once | INTRAMUSCULAR | Status: AC
  Administered 2017-01-23: 2 mg via INTRAVENOUS

## 2017-01-23 MED ORDER — ROPIVACAINE HCL 7.5 MG/ML IJ SOLN
INTRAMUSCULAR | Status: DC | PRN
  Administered 2017-01-23: 30 mL via PERINEURAL

## 2017-01-23 MED ORDER — OXYCODONE HCL 5 MG PO TABS: 5.0000 mg | ORAL_TABLET | ORAL | Status: DC | PRN

## 2017-01-23 MED ORDER — LACTATED RINGERS IV SOLN
INTRAVENOUS | Status: DC
  Administered 2017-01-23: 10:00:00 via INTRAVENOUS

## 2017-01-23 MED ORDER — PROPOFOL 10 MG/ML IV BOLUS
INTRAVENOUS | Status: AC
  Filled 2017-01-23: qty 20

## 2017-01-23 MED ORDER — DEXAMETHASONE SODIUM PHOSPHATE 10 MG/ML IJ SOLN
INTRAMUSCULAR | Status: AC
  Filled 2017-01-23: qty 1

## 2017-01-23 MED ORDER — SODIUM CHLORIDE 0.9% FLUSH: 3.0000 mL | INTRAVENOUS | Status: DC | PRN

## 2017-01-23 MED ORDER — LIDOCAINE 2% (20 MG/ML) 5 ML SYRINGE
INTRAMUSCULAR | Status: AC
Start: 2017-01-23 — End: ?
  Filled 2017-01-23: qty 5

## 2017-01-23 MED ORDER — LIDOCAINE HCL (PF) 1 % IJ SOLN
INTRAMUSCULAR | Status: AC
  Filled 2017-01-23: qty 30

## 2017-01-23 SURGICAL SUPPLY — 59 items
BINDER BREAST LRG (GAUZE/BANDAGES/DRESSINGS) IMPLANT
BINDER BREAST XLRG (GAUZE/BANDAGES/DRESSINGS) ×3 IMPLANT
BLADE SURG 10 STRL SS (BLADE) ×3 IMPLANT
BNDG COHESIVE 4X5 TAN STRL (GAUZE/BANDAGES/DRESSINGS) ×3 IMPLANT
CANISTER SUCT 3000ML PPV (MISCELLANEOUS) ×3 IMPLANT
CHLORAPREP W/TINT 26ML (MISCELLANEOUS) ×3 IMPLANT
CLIP VESOCCLUDE LG 6/CT (CLIP) ×3 IMPLANT
CLIP VESOCCLUDE MED 6/CT (CLIP) ×3 IMPLANT
CLIP VESOCCLUDE SM WIDE 6/CT (CLIP) ×3 IMPLANT
CONT SPEC 4OZ CLIKSEAL STRL BL (MISCELLANEOUS) ×6 IMPLANT
COVER PROBE W GEL 5X96 (DRAPES) ×3 IMPLANT
COVER SURGICAL LIGHT HANDLE (MISCELLANEOUS) ×3 IMPLANT
DERMABOND ADVANCED (GAUZE/BANDAGES/DRESSINGS) ×2
DERMABOND ADVANCED .7 DNX12 (GAUZE/BANDAGES/DRESSINGS) ×1 IMPLANT
DEVICE DUBIN SPECIMEN MAMMOGRA (MISCELLANEOUS) IMPLANT
DRAPE CHEST BREAST 15X10 FENES (DRAPES) ×3 IMPLANT
DRAPE UNIVERSAL PACK (DRAPES) ×3 IMPLANT
DRAPE UTILITY XL STRL (DRAPES) ×3 IMPLANT
ELECT CAUTERY BLADE 6.4 (BLADE) ×3 IMPLANT
ELECT REM PT RETURN 9FT ADLT (ELECTROSURGICAL) ×3
ELECTRODE REM PT RTRN 9FT ADLT (ELECTROSURGICAL) ×1 IMPLANT
GLOVE BIO SURGEON STRL SZ 6 (GLOVE) ×3 IMPLANT
GLOVE BIO SURGEON STRL SZ7 (GLOVE) ×3 IMPLANT
GLOVE BIOGEL PI IND STRL 6.5 (GLOVE) ×1 IMPLANT
GLOVE BIOGEL PI IND STRL 7.0 (GLOVE) ×1 IMPLANT
GLOVE BIOGEL PI IND STRL 7.5 (GLOVE) ×1 IMPLANT
GLOVE BIOGEL PI INDICATOR 6.5 (GLOVE) ×2
GLOVE BIOGEL PI INDICATOR 7.0 (GLOVE) ×2
GLOVE BIOGEL PI INDICATOR 7.5 (GLOVE) ×2
GLOVE ECLIPSE 7.5 STRL STRAW (GLOVE) ×3 IMPLANT
GLOVE INDICATOR 6.5 STRL GRN (GLOVE) ×3 IMPLANT
GLOVE SURG SS PI 6.5 STRL IVOR (GLOVE) ×3 IMPLANT
GLOVE SURG SS PI 7.0 STRL IVOR (GLOVE) ×3 IMPLANT
GOWN STRL REUS W/ TWL LRG LVL3 (GOWN DISPOSABLE) ×3 IMPLANT
GOWN STRL REUS W/TWL 2XL LVL3 (GOWN DISPOSABLE) ×3 IMPLANT
GOWN STRL REUS W/TWL LRG LVL3 (GOWN DISPOSABLE) ×6
KIT BASIN OR (CUSTOM PROCEDURE TRAY) ×3 IMPLANT
KIT MARKER MARGIN INK (KITS) ×3 IMPLANT
LIGHT WAVEGUIDE WIDE FLAT (MISCELLANEOUS) IMPLANT
NDL SAFETY ECLIPSE 18X1.5 (NEEDLE) ×1 IMPLANT
NEEDLE FILTER BLUNT 18X 1/2SAF (NEEDLE)
NEEDLE FILTER BLUNT 18X1 1/2 (NEEDLE) IMPLANT
NEEDLE HYPO 18GX1.5 SHARP (NEEDLE) ×2
NEEDLE HYPO 25GX1X1/2 BEV (NEEDLE) ×3 IMPLANT
NS IRRIG 1000ML POUR BTL (IV SOLUTION) ×3 IMPLANT
PACK SURGICAL SETUP 50X90 (CUSTOM PROCEDURE TRAY) ×3 IMPLANT
PAD ABD 8X10 STRL (GAUZE/BANDAGES/DRESSINGS) ×3 IMPLANT
PENCIL BUTTON HOLSTER BLD 10FT (ELECTRODE) ×3 IMPLANT
SPONGE LAP 18X18 X RAY DECT (DISPOSABLE) ×3 IMPLANT
STOCKINETTE IMPERVIOUS 9X36 MD (GAUZE/BANDAGES/DRESSINGS) ×3 IMPLANT
SUT MNCRL AB 4-0 PS2 18 (SUTURE) ×3 IMPLANT
SUT VIC AB 3-0 SH 8-18 (SUTURE) ×3 IMPLANT
SYR BULB 3OZ (MISCELLANEOUS) ×3 IMPLANT
SYR CONTROL 10ML LL (SYRINGE) ×3 IMPLANT
TOWEL OR 17X24 6PK STRL BLUE (TOWEL DISPOSABLE) ×3 IMPLANT
TOWEL OR 17X26 10 PK STRL BLUE (TOWEL DISPOSABLE) ×3 IMPLANT
TUBE CONNECTING 12'X1/4 (SUCTIONS) ×1
TUBE CONNECTING 12X1/4 (SUCTIONS) ×2 IMPLANT
YANKAUER SUCT BULB TIP NO VENT (SUCTIONS) ×3 IMPLANT

## 2017-01-23 NOTE — Transfer of Care (Signed)
Immediate Anesthesia Transfer of Care Note  Patient: Brenda Hartman  Procedure(s) Performed: RIGHT BREAST LUMPECTOMY WITH RADIOACTIVE SEED AND SENTINEL LYMPH NODE BIOPSY WITH LYMPHATIC MAPPI NG (Right Breast)  Patient Location: PACU  Anesthesia Type:GA combined with regional for post-op pain  Level of Consciousness: awake, alert  and oriented  Airway & Oxygen Therapy: Patient Spontanous Breathing and Patient connected to nasal cannula oxygen  Post-op Assessment: Report given to RN and Post -op Vital signs reviewed and stable  Post vital signs: Reviewed and stable  Last Vitals:  Vitals:   01/23/17 1050 01/23/17 1054  BP: 125/79   Pulse: 73 71  Resp: 19 19  Temp:    SpO2: 100% 99%    Last Pain:  Vitals:   01/23/17 1007  TempSrc: Oral      Patients Stated Pain Goal: 4 (62/83/66 [id number])  Complications: No apparent anesthesia complications

## 2017-01-23 NOTE — Anesthesia Procedure Notes (Addendum)
Anesthesia Regional Block: Pectoralis block   Pre-Anesthetic Checklist: ,, timeout performed, Correct Patient, Correct Site, Correct Laterality, Correct Procedure, Correct Position, site marked, Risks and benefits discussed,  Surgical consent,  Pre-op evaluation,  At surgeon's request and post-op pain management  Laterality: Right  Prep: chloraprep       Needles:  Injection technique: Single-shot  Needle Type: Echogenic Needle     Needle Length: 9cm  Needle Gauge: 21     Additional Needles:   Narrative:  Start time: 01/23/2017 10:45 AM End time: 01/23/2017 10:55 AM Injection made incrementally with aspirations every 5 mL.  Performed by: Personally  Anesthesiologist: Jovanna Hodges

## 2017-01-23 NOTE — Op Note (Signed)
Right Breast Radioactive seed localized lumpectomy and sentinel lymph node mapping and biopsy  Indications: This patient presents with history of right breast cancer diagnosed with palpable mass, cT2N0, upper outer quadrant, +/+/-  Pre-operative Diagnosis: Right breast cancer  Post-operative Diagnosis: same  Surgeon: Stark Klein   Anesthesia: General endotracheal anesthesia  ASA Class: 2  Procedure Details  The patient was seen in the Holding Room. The risks, benefits, complications, treatment options, and expected outcomes were discussed with the patient. The possibilities of bleeding, infection, the need for additional procedures, failure to diagnose a condition, and creating a complication requiring transfusion or operation were discussed with the patient. The patient concurred with the proposed plan, giving informed consent.  The site of surgery properly noted/marked. The patient was taken to Operating Room # 2, identified, and the procedure verified as Right Breast seed localized lumpectomy with sentinel lymph node mapping and biopsy. A Time Out was held and the above information confirmed.  Methylene blue mixed with saline injected in the subareolar location.    The right arm, breast, and chest were prepped and draped in standard fashion. The lumpectomy was performed by creating a circumareolar incision over the lateral breast near the previously placed radioactive seed.  Dissection was carried down to around the point of maximum signal intensity. The cautery was used to perform the dissection.  Hemostasis was achieved with cautery. The edges of the cavity were marked with large clips, with one each medial, lateral, inferior and superior, and two clips posteriorly.   The specimen was inked with the margin marker paint kit.    Specimen radiography confirmed inclusion of the mammographic lesion, the clip, and the seed.  The background signal in the breast was zero.  The wound was irrigated and  closed with 3-0 vicryl in layers and 4-0 monocryl subcuticular suture.    Using a hand-held gamma probe, right axillary sentinel nodes were identified transcutaneously.  An oblique incision was created below the axillary hairline.  Dissection was carried through the clavipectoral fascia.  Two deep level two axillary sentinel nodes were removed.  Counts per second were 420 and 80.    The background count was 8 cps.  The wound was irrigated.  Hemostasis was achieved with cautery.  The axillary incision was closed with a 3-0 vicryl deep dermal interrupted sutures and a 4-0 monocryl subcuticular closure.    Sterile dressings were applied. At the end of the operation, all sponge, instrument, and needle counts were correct.  Findings: grossly clear surgical margins and no adenopathy, anterior margin is skin.    Estimated Blood Loss:  min         Specimens: Right breast lumpectomy and deep level 2 axillary sentinel lymph nodes.             Complications:  None; patient tolerated the procedure well.         Disposition: PACU - hemodynamically stable.         Condition: stable

## 2017-01-23 NOTE — Anesthesia Postprocedure Evaluation (Signed)
Anesthesia Post Note  Patient: Brenda Hartman  Procedure(s) Performed: RIGHT BREAST LUMPECTOMY WITH RADIOACTIVE SEED AND SENTINEL LYMPH NODE BIOPSY WITH LYMPHATIC MAPPI NG (Right Breast)     Patient location during evaluation: PACU Anesthesia Type: General Level of consciousness: awake and alert Pain management: pain level controlled Vital Signs Assessment: post-procedure vital signs reviewed and stable Respiratory status: spontaneous breathing, nonlabored ventilation, respiratory function stable and patient connected to nasal cannula oxygen Cardiovascular status: blood pressure returned to baseline and stable Postop Assessment: no apparent nausea or vomiting Anesthetic complications: no    Last Vitals:  Vitals:   01/23/17 1054 01/23/17 1352  BP:    Pulse: 71   Resp: 19   Temp:  36.4 C  SpO2: 99%     Last Pain:  Vitals:   01/23/17 1007  TempSrc: Oral                 Rayshun Kandler

## 2017-01-23 NOTE — Interval H&P Note (Signed)
History and Physical Interval Note:  01/23/2017 12:03 PM  ICYSS SKOG  has presented today for surgery, with the diagnosis of RIGHT BREAST CANCER  The various methods of treatment have been discussed with the patient and family. After consideration of risks, benefits and other options for treatment, the patient has consented to  Procedure(s): RIGHT BREAST LUMPECTOMY WITH RADIOACTIVE SEED AND SENTINEL LYMPH NODE BIOPSY (Right) as a surgical intervention .  The patient's history has been reviewed, patient examined, no change in status, stable for surgery.  I have reviewed the patient's chart and labs.  Questions were answered to the patient's satisfaction.     Anecia Nusbaum

## 2017-01-23 NOTE — Anesthesia Procedure Notes (Signed)
Anesthesia Regional Block: Narrative:       

## 2017-01-23 NOTE — Discharge Instructions (Addendum)
[facility] °Office Phone Number [phone] ° °BREAST BIOPSY/ PARTIAL MASTECTOMY: POST OP INSTRUCTIONS ° °Always review your discharge instruction sheet given to you by the facility where your surgery was performed. ° °IF YOU HAVE DISABILITY OR FAMILY LEAVE FORMS, YOU MUST BRING THEM TO THE OFFICE FOR PROCESSING.  DO NOT GIVE THEM TO YOUR DOCTOR. ° °1. A prescription for pain medication may be given to you upon discharge.  Take your pain medication as prescribed, if needed.  If narcotic pain medicine is not needed, then you may take acetaminophen (Tylenol) or ibuprofen (Advil) as needed. °2. Take your usually prescribed medications unless otherwise directed °3. If you need a refill on your pain medication, please contact your pharmacy.  They will contact our office to request authorization.  Prescriptions will not be filled after 5pm or on week-ends. °4. You should eat very light the first 24 hours after surgery, such as soup, crackers, pudding, etc.  Resume your normal diet the day after surgery. °5. Most patients will experience some swelling and bruising in the breast.  Ice packs and a good support bra will help.  Swelling and bruising can take several days to resolve.  °6. It is common to experience some constipation if taking pain medication after surgery.  Increasing fluid intake and taking a stool softener will usually help or prevent this problem from occurring.  A mild laxative (Milk of Magnesia or Miralax) should be taken according to package directions if there are no bowel movements after 48 hours. °7. Unless discharge instructions indicate otherwise, you may remove your bandages 48 hours after surgery, and you may shower at that time.  You may have steri-strips (small skin tapes) in place directly over the incision.  These strips should be left on the skin for 7-10 days.   Any sutures or staples will be removed at the office during your follow-up visit. °8. ACTIVITIES:  You may resume  regular daily activities (gradually increasing) beginning the next day.  Wearing a good support bra or sports bra (or the breast binder) minimizes pain and swelling.  You may have sexual intercourse when it is comfortable. °a. You may drive when you no longer are taking prescription pain medication, you can comfortably wear a seatbelt, and you can safely maneuver your car and apply brakes. °b. RETURN TO WORK:  __________1 week_______________ °9. You should see your doctor in the office for a follow-up appointment approximately two weeks after your surgery.  Your doctor’s nurse will typically make your follow-up appointment when she calls you with your pathology report.  Expect your pathology report 2-3 business days after your surgery.  You may call to check if you do not hear from us after three days. ° ° °WHEN TO CALL YOUR DOCTOR: °1. Fever over 101.0 °2. Nausea and/or vomiting. °3. Extreme swelling or bruising. °4. Continued bleeding from incision. °5. Increased pain, redness, or drainage from the incision. ° °The clinic staff is available to answer your questions during regular business hours.  Please don’t hesitate to call and ask to speak to one of the nurses for clinical concerns.  If you have a medical emergency, go to the nearest emergency room or call 911.  A surgeon from [facility] is always on call at the hospital. ° °For further questions, please visit [email or web address]  ° °

## 2017-01-23 NOTE — Anesthesia Preprocedure Evaluation (Addendum)
Anesthesia Evaluation  Patient identified by MRN, date of birth, ID band Patient awake    Reviewed: Allergy & Precautions, NPO status , Patient's Chart, lab work & pertinent test results  Airway Mallampati: II  TM Distance: >3 FB Neck ROM: Full    Dental no notable dental hx.    Pulmonary neg pulmonary ROS,    Pulmonary exam normal breath sounds clear to auscultation       Cardiovascular negative cardio ROS Normal cardiovascular exam Rhythm:Regular Rate:Normal     Neuro/Psych negative neurological ROS  negative psych ROS   GI/Hepatic negative GI ROS, Neg liver ROS,   Endo/Other  negative endocrine ROS  Renal/GU negative Renal ROS  negative genitourinary   Musculoskeletal negative musculoskeletal ROS (+)   Abdominal   Peds negative pediatric ROS (+)  Hematology negative hematology ROS (+)   Anesthesia Other Findings   Reproductive/Obstetrics negative OB ROS                             Anesthesia Physical Anesthesia Plan  ASA: II  Anesthesia Plan: General   Post-op Pain Management: GA combined w/ Regional for post-op pain   Induction: Intravenous  PONV Risk Score and Plan: 3 and Ondansetron, Dexamethasone, Midazolam, Treatment may vary due to age or medical condition and Scopolamine patch - Pre-op  Airway Management Planned: LMA  Additional Equipment:   Intra-op Plan:   Post-operative Plan:   Informed Consent:   Plan Discussed with: CRNA and Surgeon  Anesthesia Plan Comments: ( )       Anesthesia Quick Evaluation

## 2017-01-23 NOTE — Anesthesia Procedure Notes (Signed)
Procedure Name: LMA Insertion Date/Time: 01/23/2017 12:18 PM Performed by: Garrison Columbus T Pre-anesthesia Checklist: Patient identified, Emergency Drugs available, Suction available and Patient being monitored Patient Re-evaluated:Patient Re-evaluated prior to induction Oxygen Delivery Method: Circle System Utilized Preoxygenation: Pre-oxygenation with 100% oxygen Induction Type: IV induction LMA: LMA inserted LMA Size: 4.0 Number of attempts: 1 Airway Equipment and Method: Bite block Placement Confirmation: positive ETCO2 Tube secured with: Tape Dental Injury: Teeth and Oropharynx as per pre-operative assessment

## 2017-01-24 ENCOUNTER — Encounter (HOSPITAL_COMMUNITY): Payer: Self-pay | Admitting: General Surgery

## 2017-01-29 NOTE — Progress Notes (Signed)
Please let patient know margins and LN are negative.

## 2017-01-30 ENCOUNTER — Ambulatory Visit (HOSPITAL_BASED_OUTPATIENT_CLINIC_OR_DEPARTMENT_OTHER): Payer: PRIVATE HEALTH INSURANCE | Admitting: Hematology and Oncology

## 2017-01-30 ENCOUNTER — Telehealth: Payer: Self-pay | Admitting: *Deleted

## 2017-01-30 ENCOUNTER — Encounter: Payer: Self-pay | Admitting: Hematology and Oncology

## 2017-01-30 DIAGNOSIS — C50411 Malignant neoplasm of upper-outer quadrant of right female breast: Secondary | ICD-10-CM | POA: Diagnosis not present

## 2017-01-30 DIAGNOSIS — Z170 Estrogen receptor positive status [ER+]: Secondary | ICD-10-CM

## 2017-01-30 NOTE — Telephone Encounter (Signed)
Ordered oncotype per Dr. Lindi Adie.  Faxed requisition to pathology an confirmed receipt.

## 2017-01-30 NOTE — Assessment & Plan Note (Signed)
12/22/2016: Palpable right breast mass UOQ at 9:30 position measured 2.8 cm axilla negative, ultrasound biopsy showed grade 1 IDC with DCIS, ER 100%, PR 100%, Ki-67 10%, HER-2 negative ratio 1.06, T2 N0 stage IB AJCC 8  01/21/2017;Right lumpectomy: IDC grade 1, 3 cm, DCIS with necrosis, margins negative,0/2 lymph nodes negative, ER 100%, PR 90%, Ki-67 10%, HER-2 negative ratio 1.06, T2 N0 stage IB  Pathology counseling: I discussed the final pathology report of the patient provided  a copy of this report. I discussed the margins as well as lymph node surgeries. We also discussed the final staging along with previously performed ER/PR and HER-2/neu testing.  Recommendation: 1. Oncotype DX testing to determine if chemotherapy would be of any benefit followed by 2. Adjuvant radiation therapy followed by 3. Adjuvant antiestrogen therapy  Return to clinic based upon Oncotype DX test results

## 2017-01-30 NOTE — Progress Notes (Signed)
Patient left voicemail regarding financial questions or concerns.  Reviewed chart and no treatment plan in place. Patient seeing doctor regarding follow up.  Went to lobby area to get patient to discuss her concerns before her appointment.  Introduced myself and asked her concerns. Patient states her insurance is terrible and she is worried about paying for treatment. Asked patient what her treatment was going to be. Patient states she had surgery and will be doing radiation and no chemotherapy.  Advised patient Jenny Reichmann in Radiation will be her advocate for Radiation questions and concerns. Patient also asked about [employer]. She was given number to [place] and wanted to know if she could see her today. Advised her to call and leave voicemail.Also provided her Cindy's contact information to schedule an appointment with her. Patient has my contact information should chemotherapy become a part of her treatment plan. She verbalized understanding but appeared to be very nervous.

## 2017-01-30 NOTE — Progress Notes (Signed)
Patient Care Team: Fanny Bien, MD as PCP - General (Family Medicine) Stark Klein, MD as Consulting Physician (General Surgery) Nicholas Lose, MD as Consulting Physician (Hematology and Oncology) Kyung Rudd, MD as Consulting Physician (Radiation Oncology)  DIAGNOSIS:  Encounter Diagnosis  Name Primary?  . Malignant neoplasm of upper-outer quadrant of right breast in female, estrogen receptor positive ([place])     SUMMARY OF ONCOLOGIC HISTORY:   Malignant neoplasm of upper-outer quadrant of right breast in female, estrogen receptor positive ([place])   12/22/2016 Initial Diagnosis    Palpable right breast mass UOQ at 9:30 position measured 2.8 cm axilla negative, ultrasound biopsy showed grade 1 IDC with DCIS, ER 100%, PR 100%, Ki-67 10%, HER-2 negative ratio 1.06, T2 N0 stage IB AJCC 8      01/23/2017 Surgery    Right lumpectomy: IDC grade 1, 3 cm, DCIS with necrosis, margins negative,0/2 lymph nodes negative, ER 100%, PR 90%, Ki-67 10%, HER-2 negative ratio 1.06, T2 N0 stage IB       CHIEF COMPLIANT: follow-up after recent right lumpectomy  INTERVAL HISTORY: Brenda Hartman is a 29 with above-mentioned history of right breast cancer underwent right lumpectomy and is here today to discuss the pathology report. She is healing very well from the recent surgery. She has not been of much pain or discomfort.  REVIEW OF SYSTEMS:   Constitutional: Denies fevers, chills or abnormal weight loss Eyes: Denies blurriness of vision Ears, nose, mouth, throat, and face: Denies mucositis or sore throat Respiratory: Denies cough, dyspnea or wheezes Cardiovascular: Denies palpitation, chest discomfort Gastrointestinal:  Denies nausea, heartburn or change in bowel habits Skin: Denies abnormal skin rashes Lymphatics: Denies new lymphadenopathy or easy bruising Neurological:Denies numbness, tingling or new weaknesses Behavioral/Psych: Mood is stable, no new changes  Extremities: No lower  extremity edema Breast: recent right lumpectomy All other systems were reviewed with the patient and are negative.  I have reviewed the past medical history, past surgical history, social history and family history with the patient and they are unchanged from previous note.  ALLERGIES:  is allergic to morphine and related.  MEDICATIONS:  Current Outpatient Prescriptions  Medication Sig Dispense Refill  . cetirizine (ZYRTEC) 5 MG tablet Take 5 mg by mouth once as needed for allergies.    . diphenhydrAMINE (BENADRYL) 25 MG tablet Take 25 mg by mouth every 6 (six) hours as needed.    Marland Kitchen oxyCODONE (OXY IR/ROXICODONE) 5 MG immediate release tablet Take 1-2 tablets (5-10 mg total) by mouth every 6 (six) hours as needed for moderate pain, severe pain or breakthrough pain. 15 tablet 0   No current facility-administered medications for this visit.     PHYSICAL EXAMINATION: ECOG PERFORMANCE STATUS: 1 - Symptomatic but completely ambulatory  Vitals:   01/30/17 1115  BP: 120/82  Pulse: 87  Resp: 19  Temp: 97.9 F (36.6 C)  SpO2: 100%   Filed Weights   01/30/17 1115  Weight: 176 lb 4.8 oz (80 kg)    GENERAL:alert, no distress and comfortable SKIN: skin color, texture, turgor are normal, no rashes or significant lesions EYES: normal, Conjunctiva are pink and non-injected, sclera clear OROPHARYNX:no exudate, no erythema and lips, buccal mucosa, and tongue normal  NECK: supple, thyroid normal size, non-tender, without nodularity LYMPH:  no palpable lymphadenopathy in the cervical, axillary or inguinal LUNGS: clear to auscultation and percussion with normal breathing effort HEART: regular rate & rhythm and no murmurs and no lower extremity edema ABDOMEN:abdomen soft, non-tender and normal  bowel sounds MUSCULOSKELETAL:no cyanosis of digits and no clubbing  NEURO: alert & oriented x 3 with fluent speech, no focal motor/sensory deficits EXTREMITIES: No lower extremity edema  LABORATORY  DATA:  I have reviewed the data as listed   Chemistry      Component Value Date/Time   NA 139 01/03/2017 1142   K 4.2 01/03/2017 1142   CO2 23 01/03/2017 1142   BUN 14.5 01/03/2017 1142   CREATININE 0.8 01/03/2017 1142      Component Value Date/Time   CALCIUM 9.7 01/03/2017 1142   ALKPHOS 69 01/03/2017 1142   AST 22 01/03/2017 1142   ALT 16 01/03/2017 1142   BILITOT 1.32 (H) 01/03/2017 1142       Lab Results  Component Value Date   WBC 5.2 01/16/2017   HGB 13.9 01/16/2017   HCT 41.2 01/16/2017   MCV 84.6 01/16/2017   PLT 136 (L) 01/16/2017   NEUTROABS 1.4 (L) 01/03/2017    ASSESSMENT & PLAN:  Malignant neoplasm of upper-outer quadrant of right breast in female, estrogen receptor positive ([place]) 12/22/2016: Palpable right breast mass UOQ at 9:30 position measured 2.8 cm axilla negative, ultrasound biopsy showed grade 1 IDC with DCIS, ER 100%, PR 100%, Ki-67 10%, HER-2 negative ratio 1.06, T2 N0 stage IB AJCC 8  01/21/2017;Right lumpectomy: IDC grade 1, 3 cm, DCIS with necrosis, margins negative,0/2 lymph nodes negative, ER 100%, PR 90%, Ki-67 10%, HER-2 negative ratio 1.06, T2 N0 stage IB  Pathology counseling: I discussed the final pathology report of the patient provided  a copy of this report. I discussed the margins as well as lymph node surgeries. We also discussed the final staging along with previously performed ER/PR and HER-2/neu testing.  Recommendation: 1. Oncotype DX testing to determine if chemotherapy would be of any benefit followed by 2. Adjuvant radiation therapy followed by 3. Adjuvant antiestrogen therapy  Return to clinic based upon Oncotype DX test results   I spent 25 minutes talking to the patient of which more than half was spent in counseling and coordination of care.  No orders of the defined types were placed in this encounter.  The patient has a good understanding of the overall plan. she agrees with it. she will call with any problems that  may develop before the next visit here.   Rulon Eisenmenger, MD 01/30/17

## 2017-02-07 ENCOUNTER — Encounter: Payer: Self-pay | Admitting: Radiation Oncology

## 2017-02-07 ENCOUNTER — Telehealth: Payer: Self-pay | Admitting: *Deleted

## 2017-02-07 DIAGNOSIS — Z170 Estrogen receptor positive status [ER+]: Principal | ICD-10-CM

## 2017-02-07 DIAGNOSIS — C50411 Malignant neoplasm of upper-outer quadrant of right female breast: Secondary | ICD-10-CM

## 2017-02-07 NOTE — Telephone Encounter (Signed)
Received oncotype score of 0/3%. Physician team notified. Called pt to discuss results. Unable to leave message.Will call back.

## 2017-02-08 ENCOUNTER — Encounter: Payer: Self-pay | Admitting: Anatomic Pathology & Clinical Pathology

## 2017-02-09 ENCOUNTER — Encounter (HOSPITAL_COMMUNITY): Payer: Self-pay

## 2017-02-09 NOTE — Progress Notes (Signed)
  Location of Breast Cancer: Upper-outer quadrant of right breast in female  Histology per Pathology Report:   Diagnosis 01-23-17 1. Breast, lumpectomy, Right w/seed - INVASIVE DUCTAL CARCINOMA, NOTTINGHAM GRADE 1 OF 3, 3.0 CM - DUCTAL CARCINOMA IN SITU WITH NECROSIS - CALCIFICATIONS ASSOCIATED WITH CARCINOMA - MARGINS UNINVOLVED BY CARCINOMA (0.2 CM, MEDIAL MARGIN) - VESSELS WITH CALCIFICATIONS - SEE ONCOLOGY TABLE BELOW 2. Lymph node, sentinel, biopsy, Right axillary #1 - NO CARCINOMA IDENTIFIED IN ONE LYMPH NODE (0/1) 3. Lymph node, sentinel, biopsy, Right axillary #2 - NO CARCINOMA IDENTIFIED IN ONE LYMPH NODE (0/1)  Receptor Status: ER(100% +), PR ( 100% +) Her2-neu ( -), Ki-(10%)   Diagnosis 12-22-16 Breast, right, needle core biopsy, 9:30 o'clock - INVASIVE DUCTAL CARCINOMA, SEE COMMENT. - DUCTAL CARCINOMA IN SITU.  Receptor Status: ER(100%), PR ( 100%) Her2-neu ( NEG), Ki-(10%)  Did patient present with symptoms (if so, please note symptoms) or was this found on screening mammography?:She felt a lump in her right breast and it was confirmed with a mammogram.  Past/Anticipated interventions by surgeon, if any: Dr. Dorris Fetch 01/23/2017 Right Breast Radioactive seed localized lumpectomy and sentinel lymph node mapping and biopsy, 12-22-16 Breast, right, needle core biopsy,  Past/Anticipated interventions by medical oncology, if any: Dr. Lindi Adie 01-31-17 Oncotype DX result 0, 01-03-17 Adjuvant radiation therapy, Adjuvant antiestrogen therapy    Lymphedema issues,if any: No  Pain issues, if any: No  SAFETY ISSUES:  Prior radiation? No  Pacemaker/ICD? No  Possible current pregnancy?No  Is the patient on methotrexate? No  Current Complaints / other details:  Menarche 61 ,G1P1 wtih first child born below age 63, BC, She has had a hysterectomy and BSO  , menopause 45, HTR Wt Readings from Last 3 Encounters:  02/20/17 183 lb (83 kg)  01/30/17 176 lb 4.8 oz (80 kg)   01/23/17 181 lb (82.1 kg)  BP 124/83   Pulse 78   Temp 97.6 F (36.4 C) (Oral)   Resp 18   Ht '5\' 6"'$  (1.676 m)   Wt 183 lb (83 kg)   SpO2 100%   BMI 29.54 kg/m

## 2017-02-15 ENCOUNTER — Ambulatory Visit: Payer: No Typology Code available for payment source | Attending: General Surgery | Admitting: Physical Therapy

## 2017-02-15 ENCOUNTER — Encounter: Payer: Self-pay | Admitting: Physical Therapy

## 2017-02-15 DIAGNOSIS — C50411 Malignant neoplasm of upper-outer quadrant of right female breast: Secondary | ICD-10-CM | POA: Insufficient documentation

## 2017-02-15 DIAGNOSIS — R293 Abnormal posture: Secondary | ICD-10-CM | POA: Insufficient documentation

## 2017-02-15 DIAGNOSIS — N6489 Other specified disorders of breast: Secondary | ICD-10-CM | POA: Insufficient documentation

## 2017-02-15 DIAGNOSIS — Z170 Estrogen receptor positive status [ER+]: Secondary | ICD-10-CM | POA: Insufficient documentation

## 2017-02-15 DIAGNOSIS — Z483 Aftercare following surgery for neoplasm: Secondary | ICD-10-CM | POA: Insufficient documentation

## 2017-02-15 NOTE — Therapy (Signed)
Brenda Hartman, [name], [address] Phone: [phone]   Fax:  [phone]  Physical Therapy Treatment  Patient Details  Name: Brenda Hartman MRN: [id number] Date of Birth: 12-03-1955 Referring Provider: Dr. Stark Hartman  Encounter Date: 02/15/2017      PT End of Session - 02/15/17 1222    Visit Number 2   Number of Visits 2   PT Start Time 1025   PT Stop Time 1100   PT Time Calculation (min) 35 min   Activity Tolerance Patient tolerated treatment well   Behavior During Therapy [facility] for tasks assessed/performed      Past Medical History:  Diagnosis Date  . Cancer [facility])     Past Surgical History:  Procedure Laterality Date  . ABDOMINAL HYSTERECTOMY  1998   due to fibroids  . BREAST LUMPECTOMY WITH RADIOACTIVE SEED AND SENTINEL LYMPH NODE BIOPSY Right 01/23/2017   Procedure: RIGHT BREAST LUMPECTOMY WITH RADIOACTIVE SEED AND SENTINEL LYMPH NODE BIOPSY WITH LYMPHATIC MAPPI NG;  Surgeon: Brenda Klein, MD;  Location: [facility];  Service: General;  Laterality: Right;  . COLONOSCOPY      There were no vitals filed for this visit.      Subjective Assessment - 02/15/17 1027    Subjective Patient underwent a right lumpectomy and sentinel node biopsy (2 negative nodes) on 01/23/17. Oncotype results were low and no chemo is needed. She will meet with radiation oncology next week. She has right breast swelling which she feels has worsened over the past 3 days. She sees Dr. Barry Hartman in 2 weeks.   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 It measured 2.8 cm and was located in the upper outer quadrant with a Ki67 of 10%. It is ER/PR positive and HER2 negative. Patient underwent a right lumpectomy and sentinel node biopsy (2 negative nodes) on 01/23/17.   Patient Stated Goals Reduce breast swelling; use arm normally   Currently in Pain? Yes   Pain Score  3    Pain Location Breast   Pain Orientation Right   Pain Descriptors / Indicators Tender   Pain Type Surgical pain   Pain Onset 1 to 4 weeks ago   Pain Frequency Intermittent   Aggravating Factors  Unknown   Pain Relieving Factors Unknown   Multiple Pain Sites No            [facility] PT Assessment - 02/15/17 0001      Assessment   Medical Diagnosis s/p right lumpectomy and SLNB   Referring Provider Dr. Stark Hartman   Onset Date/Surgical Date 01/23/17   Hand Dominance Right   Prior Therapy none     Precautions   Precautions Other (comment)   Precaution Comments active cancer; right arm lymphedema risk     Restrictions   Weight Bearing Restrictions No     Balance Screen   Has the patient fallen in the past 6 months No   Has the patient had a decrease in activity level because of a fear of falling?  No   Is the patient reluctant to leave their home because of a fear of falling?  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 [employer] She walks 30 min/day     Cognition   Overall Cognitive Status Within Functional Limits for tasks assessed     Posture/Postural Control   Posture/Postural Control Postural limitations   Postural Limitations Forward head;Rounded Shoulders     ROM / Strength   AROM / PROM / Strength AROM;Strength     AROM   Overall AROM Comments Left shoulder all WNL   AROM Assessment Site Shoulder;Cervical   Right/Left Shoulder Right;Left   Right Shoulder Extension 47 Degrees   Right Shoulder Flexion 160 Degrees   Right Shoulder ABduction 174 Degrees   Right Shoulder Internal Rotation 67 Degrees   Right Shoulder External Rotation 90 Degrees   Cervical Flexion WNL   Cervical Extension WNL    Cervical - Right Side Bend WNL   Cervical - Left Side Bend WNL   Cervical - Right Rotation WNL   Cervical - Left Rotation WNL     Palpation   Palpation comment Visible and palpable fluid present in right lateral and inferior breast           LYMPHEDEMA/ONCOLOGY QUESTIONNAIRE - 02/15/17 1039      Type   Cancer Type Right breast cancer     Surgeries   Lumpectomy Date 01/23/17   Sentinel Lymph Node Biopsy Date 01/23/17   Number Lymph Nodes Removed 2     Treatment   Active Chemotherapy Treatment No   Past Chemotherapy Treatment No   Active Radiation Treatment No   Past Radiation Treatment No   Current Hormone Treatment No   Past Hormone Therapy No     What other symptoms do you have   Are you Having Heaviness or Tightness No   Are you having Pain Yes   Are you having pitting edema No   Is it Hard or Difficult finding clothes that fit No   Do you have infections No   Is there Decreased scar mobility Yes   Stemmer Sign No     Right Upper Extremity Lymphedema   10 cm Proximal to Olecranon Process 31 cm   Olecranon Process 25.3 cm   10 cm Proximal to Ulnar Styloid Process 21.4 cm   Just Proximal to Ulnar Styloid Process 15.8 cm   Across Hand at [employer] 20 cm   At [place] of 2nd Digit 6 cm     Left Upper Extremity Lymphedema   10 cm Proximal to Olecranon Process 31.6 cm   Olecranon Process 25.4 cm   10 cm Proximal to Ulnar Styloid Process 21.5 cm   Just Proximal to Ulnar Styloid Process 15.9 cm   Across Hand at [employer] 18.8 cm   At [place] of 2nd Digit 5.8 cm           Quick Dash - 02/15/17 0001    Open a tight or new jar Mild difficulty   Do heavy household chores (wash walls, wash floors) Mild difficulty   Carry a shopping bag or briefcase Mild difficulty   Wash your back Mild difficulty   Use a knife to cut food No difficulty   Recreational activities in which you take some force or impact through your arm, shoulder, or hand (golf, hammering,  tennis) Mild difficulty   During the past week, to what extent has your arm, shoulder or hand problem interfered with your normal social activities with family, friends, neighbors, or groups? Not at all   During the  past week, to what extent has your arm, shoulder or hand problem limited your work or other regular daily activities Slightly   Arm, shoulder, or hand pain. None   Tingling (pins and needles) in your arm, shoulder, or hand None   Difficulty Sleeping No difficulty   DASH Score 13.64 %                             Breast Clinic Goals - 01/03/17 2044      Patient will be able to verbalize understanding of pertinent lymphedema risk reduction practices relevant to her diagnosis specifically related to skin care.   Time 1   Period Days   Status Achieved     Patient will be able to return demonstrate and/or verbalize understanding of the post-op home exercise program related to regaining shoulder range of motion.   Time 1   Period Days   Status Achieved     Patient will be able to verbalize understanding of the importance of attending the postoperative After Breast Cancer Class for further lymphedema risk reduction education and therapeutic exercise.   Time 1   Period Days   Status Achieved              Plan - 02/15/17 1225    Clinical Impression Statement Patient is doing very well following a right lumpectomy and sentinel node biopsy. Her shoulder ROM and function has returned to baseline. Her only concern is right breast edema. A message was sent to her surgeon letting her know of this concern and the response from the MD was that she could work her in tomorrow. This swelling is unlikely lymphedema as it is only a few weeks following surgey and may be a result of a seroma but if she needs manual lymph drainage, we are happy to do that and would benefit the swelling. She has no other PT needs at this time. She has returned to both of her full time jobs  and reports no limitations.   PT Frequency --  Will create new plan if breast swelling does not resolve with MD intervention.   PT Treatment/Interventions ADLs/Self Care Home Management;Patient/family education   PT Next Visit Plan Will determine plan for breast swelling if MD intervention does not reduce swelling or if MD orders PT for edema.   Consulted and Agree with Plan of Care Patient      Patient will benefit from skilled therapeutic intervention in order to improve the following deficits and impairments:     Visit Diagnosis: Carcinoma of upper-outer quadrant of right breast in female, estrogen receptor positive ([place])  Abnormal posture  Edema of breast  Aftercare following surgery for neoplasm     Problem List Patient Active Problem List   Diagnosis Date Noted  . Malignant neoplasm of upper-outer quadrant of right breast in female, estrogen receptor positive ([place]) 01/02/2017   Annia Friendly, PT 02/15/17 12:30 PM  [place], [name], [address] Phone: [phone]   Fax:  [phone]  Name: Brenda Hartman MRN: [id number] Date of Birth: [birth year]

## 2017-02-20 ENCOUNTER — Ambulatory Visit
Admission: RE | Admit: 2017-02-20 | Discharge: 2017-02-20 | Disposition: A | Discharge status: Home / Self Care | Payer: No Typology Code available for payment source | Source: Ambulatory Visit | Attending: Radiation Oncology | Admitting: Radiation Oncology

## 2017-02-20 ENCOUNTER — Encounter: Payer: Self-pay | Admitting: Radiation Oncology

## 2017-02-20 VITALS — BP 124/83 | HR 78 | Temp 97.6°F | Resp 18 | Ht 66.0 in | Wt 183.0 lb

## 2017-02-20 DIAGNOSIS — Z90710 Acquired absence of both cervix and uterus: Secondary | ICD-10-CM | POA: Diagnosis not present

## 2017-02-20 DIAGNOSIS — C50411 Malignant neoplasm of upper-outer quadrant of right female breast: Secondary | ICD-10-CM

## 2017-02-20 DIAGNOSIS — Z170 Estrogen receptor positive status [ER+]: Principal | ICD-10-CM

## 2017-02-20 DIAGNOSIS — Z9889 Other specified postprocedural states: Secondary | ICD-10-CM | POA: Diagnosis not present

## 2017-02-20 DIAGNOSIS — Z885 Allergy status to narcotic agent status: Secondary | ICD-10-CM | POA: Diagnosis not present

## 2017-02-20 DIAGNOSIS — Z510 Encounter for antineoplastic radiation therapy: Secondary | ICD-10-CM | POA: Diagnosis present

## 2017-02-20 HISTORY — DX: Malignant neoplasm of unspecified site of unspecified female breast: C50.919

## 2017-02-20 NOTE — Progress Notes (Deleted)
Radiation Oncology         (336) [phone] ________________________________  Name: Brenda Hartman        MRN: [id number]  Date of Service: 02/20/2017 DOB: Sep 26, 1955  [id number], Mechele Claude, MD  Nicholas Lose, MD     REFERRING PHYSICIAN: Nicholas Lose, MD   DIAGNOSIS: There were no encounter diagnoses.   HISTORY OF PRESENT ILLNESS: Brenda Hartman is a 61 y.o. female seen in the multidisciplinary breast clinic for a new diagnosis of a right  breast cancer. The patient was noted to have a palpable mass in the right breast. Diagnostic imaging revealed a 2.8 x 2.4 x 1.7 cm mass in the 9:30 position of the right breast, and her axilla was negative for adenopathy by ultrasound. A biopsy on 12/22/16 of her mass revealed a grade 1 ER/PR positive, HER2 negative invasive ductal carcinoma with DCIS and her Ki 67 was 10%. She comes today to discuss options of radiotherapy.   PREVIOUS RADIATION THERAPY: No   PAST MEDICAL HISTORY:  Past Medical History:  Diagnosis Date  . Cancer [facility])        PAST SURGICAL HISTORY: Past Surgical History:  Procedure Laterality Date  . ABDOMINAL HYSTERECTOMY  1998   due to fibroids  . COLONOSCOPY       FAMILY HISTORY:  Family History  Problem Relation Age of Onset  . Cancer Neg Hx      SOCIAL HISTORY:  reports that  has never smoked. she has never used smokeless tobacco. She reports that she does not drink alcohol or use drugs. The patient lives in [place]. She is single. 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   ALLERGIES: Morphine and related   MEDICATIONS:  Current Outpatient Medications  Medication Sig Dispense Refill  . cetirizine (ZYRTEC) 5 MG tablet Take 5 mg by mouth once as needed for allergies.    . diphenhydrAMINE (BENADRYL) 25 MG tablet Take 25 mg by mouth every 6 (six) hours as needed.    Marland Kitchen oxyCODONE (OXY IR/ROXICODONE) 5 MG immediate release tablet Take 1-2 tablets (5-10 mg total) by mouth every 6 (six) hours as needed for  moderate pain, severe pain or breakthrough pain. 15 tablet 0   No current facility-administered medications for this encounter.      REVIEW OF SYSTEMS: On review of systems, the patient reports that she is doing well overall. She denies any chest pain, shortness of breath, cough, fevers, chills, night sweats, unintended weight changes. She denies any bowel or bladder disturbances, and denies abdominal pain, nausea or vomiting. She denies any new musculoskeletal or joint aches or pains. A complete review of systems is obtained and is otherwise negative.     PHYSICAL EXAM:  Wt Readings from Last 3 Encounters:  01/30/17 176 lb 4.8 oz (80 kg)  01/23/17 181 lb (82.1 kg)  01/16/17 (P) 179 lb 11.2 oz (81.5 kg)   Temp Readings from Last 3 Encounters:  01/30/17 97.9 F (36.6 C) (Oral)  01/23/17 (!) 97.2 F (36.2 C)  01/03/17 97.7 F (36.5 C) (Oral)   BP Readings from Last 3 Encounters:  01/30/17 120/82  01/23/17 123/88  01/16/17 (!) (P) 143/88   Pulse Readings from Last 3 Encounters:  01/30/17 87  01/23/17 65  01/16/17 (P) 67     In general this is a well appearing African American  female in no acute distress. She is alert and oriented x4 and appropriate throughout the examination. HEENT reveals that the patient is  normocephalic, atraumatic. EOMs are intact. PERRLA. Skin is intact without any evidence of gross lesions. Cardiovascular exam reveals a regular rate and rhythm, no clicks rubs or murmurs are auscultated. Chest is clear to auscultation bilaterally. Lymphatic assessment is performed and does not reveal any adenopathy in the cervical, supraclavicular, axillary, or inguinal chains. Bilateral breast exam is performed and reveals fullness in the right breast without nipple discharge or bleeding. The left breast is negative for palpable mass.  Abdomen has active bowel sounds in all quadrants and is intact. The abdomen is soft, non tender, non distended. Lower extremities are negative  for pretibial pitting edema, deep calf tenderness, cyanosis or clubbing.   ECOG = 1  0 - Asymptomatic (Fully active, able to carry on all predisease activities without restriction)  1 - Symptomatic but completely ambulatory (Restricted in physically strenuous activity but ambulatory and able to carry out work of a light or sedentary nature. For example, light housework, office work)  2 - Symptomatic, <50% in bed during the day (Ambulatory and capable of all self care but unable to carry out any work activities. Up and about more than 50% of waking hours)  3 - Symptomatic, >50% in bed, but not bedbound (Capable of only limited self-care, confined to bed or chair 50% or more of waking hours)  4 - Bedbound (Completely disabled. Cannot carry on any self-care. Totally confined to bed or chair)  5 - Death   Eustace Pen MM, Creech RH, Tormey DC, et al. [phone]). "Toxicity and response criteria of the [facility]". [place] Oncol. 5 (6): 649-55    LABORATORY DATA:  Lab Results  Component Value Date   WBC 5.2 01/16/2017   HGB 13.9 01/16/2017   HCT 41.2 01/16/2017   MCV 84.6 01/16/2017   PLT 136 (L) 01/16/2017   Lab Results  Component Value Date   NA 139 01/03/2017   K 4.2 01/03/2017   CO2 23 01/03/2017   Lab Results  Component Value Date   ALT 16 01/03/2017   AST 22 01/03/2017   ALKPHOS 69 01/03/2017   BILITOT 1.32 (H) 01/03/2017      RADIOGRAPHY: Nm Sentinel Node Inj-no Rpt (breast)  Result Date: 02/14/2017 There is no Radiologist interpretation  for this exam.  Mm Breast Surgical Specimen  Result Date: 01/23/2017 CLINICAL DATA:  Right lumpectomy for invasive duct carcinoma. EXAM: SPECIMEN RADIOGRAPH OF THE RIGHT BREAST COMPARISON:  Previous exam(s). FINDINGS: Status post excision of the right breast. The radioactive seed, mass and ribbon shaped biopsy marker clip are present, completely intact, and were marked for pathology. IMPRESSION: Specimen  radiograph of the right breast. Electronically Signed   By: Claudie Revering M.D.   On: 01/23/2017 13:05       IMPRESSION/PLAN: 1. Stage IB, cT2N0Mx grade 1 ER/PR positive invasive ductal carcinoma of the right breast. Dr. Lisbeth Renshaw discusses the pathology findings and reviews the nature of invasive ductal  disease. The consensus from the breast conference included  breast conservation with lumpectomy with sentinel mapping or mastectomy with sentinel node evaluatoin. Oncotype testing will be performed to determine a role for chemotherapy. Provided that chemotherapy is not indicated, the patient's course would then be followed by external radiotherapy to the breast if she undergoes lumpectomy followed by antiestrogen therapy. We discussed the risks, benefits, short, and long term effects of radiotherapy, and the patient is interested in proceeding as she is leaning toward lumpectomy. Dr. Lisbeth Renshaw discusses the delivery and logistics of radiotherapy and would  anticipate a 4 week course. We will see her back about 2 weeks postoperatively if she undergoes lumpectomy to move forward with the simulation and planning process and anticipate starting radiotherapy about 4 weeks after surgery.    The above documentation reflects my direct findings during this shared patient visit. Please see the separate note by Dr. Lisbeth Renshaw on this date for the remainder of the patient's plan of care.    Carola Rhine, PAC

## 2017-02-20 NOTE — Progress Notes (Signed)
Radiation Oncology         (336) [phone] ________________________________  Name: Brenda Hartman        MRN: [id number]  Date of Service: 02/20/2017 DOB: Aug 25, 1955  [id number], Mechele Claude, MD  Nicholas Lose, MD     REFERRING PHYSICIAN: Nicholas Lose, MD   DIAGNOSIS: The encounter diagnosis was Malignant neoplasm of upper-outer quadrant of right breast in female, estrogen receptor positive ([place]).   HISTORY OF PRESENT ILLNESS: Brenda Hartman is a 61 y.o. female originally seen in the multidisciplinary breast clinic for a new diagnosis of a right  breast cancer. The patient was noted to have a palpable mass in the right breast. Diagnostic imaging revealed a 2.8 x 2.4 x 1.7 cm mass in the 9:30 position of the right breast, and her axilla was negative for adenopathy by ultrasound. A biopsy on 12/22/16 of her mass revealed a grade 1 ER/PR positive, HER2 negative invasive ductal carcinoma with DCIS and her Ki 67 was 10%.   She underwent right lumpectomy on 01/23/17 revealing a grade 1 invasive ductal carcinoma with DCIS and necrosis. The tumor measured 3 cm, and of the 2 nodes sampled, neither contained any disease, and her margins were negative. Her oncotype score was 0, and she will not receive chemotherapy. She did have a seroma aspirated last week with Dr. Barry Dienes and comes today to discuss beginning radiotherapy.   PREVIOUS RADIATION THERAPY: No   PAST MEDICAL HISTORY:  Past Medical History:  Diagnosis Date  . Breast cancer ([place])        PAST SURGICAL HISTORY: Past Surgical History:  Procedure Laterality Date  . ABDOMINAL HYSTERECTOMY  1998   due to fibroids  . COLONOSCOPY       FAMILY HISTORY:  Family History  Problem Relation Age of Onset  . Cancer Neg Hx      SOCIAL HISTORY:  reports that  has never smoked. she has never used smokeless tobacco. She reports that she does not drink alcohol or use drugs. The patient lives in [place]. She is single. 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   ALLERGIES: Morphine and related and Oxycodone   MEDICATIONS:  Current Outpatient Medications  Medication Sig Dispense Refill  . cetirizine (ZYRTEC) 5 MG tablet Take 5 mg by mouth once as needed for allergies.    . diphenhydrAMINE (BENADRYL) 25 MG tablet Take 25 mg by mouth every 6 (six) hours as needed.     No current facility-administered medications for this encounter.      REVIEW OF SYSTEMS: On review of systems, the patient reports that she is doing well overall. She does feel like some of the fluid has reaccumulated in the right breast but denies any feeling of tightness like she had last week prior to aspiration. She denies any heat of the skin or drainage from her incision site. She denies any chest pain, shortness of breath, cough, fevers, chills, night sweats, unintended weight changes. She denies any bowel or bladder disturbances, and denies abdominal pain, nausea or vomiting. She denies any new musculoskeletal or joint aches or pains. A complete review of systems is obtained and is otherwise negative.     PHYSICAL EXAM:  Wt Readings from Last 3 Encounters:  02/20/17 183 lb (83 kg)  01/30/17 176 lb 4.8 oz (80 kg)  01/23/17 181 lb (82.1 kg)   Temp Readings from Last 3 Encounters:  02/20/17 97.6 F (36.4 C) (Oral)  01/30/17 97.9 F (36.6 C) (Oral)  01/23/17 (!) 97.2 F (36.2 C)   BP Readings from Last 3 Encounters:  02/20/17 124/83  01/30/17 120/82  01/23/17 123/88   Pulse Readings from Last 3 Encounters:  02/20/17 78  01/30/17 87  01/23/17 65     In general this is a well appearing African American female in no acute distress. She's alert and oriented x4 and appropriate throughout the examination. Cardiopulmonary assessment is negative for acute distress and she exhibits normal effort. The right breast is intact with well healed incision sites. No lesions or bleeding is noted. There is fullness along the right lower outer quadrant but not  to the same extent as previously noted per the patient.    ECOG = 1  0 - Asymptomatic (Fully active, able to carry on all predisease activities without restriction)  1 - Symptomatic but completely ambulatory (Restricted in physically strenuous activity but ambulatory and able to carry out work of a light or sedentary nature. For example, light housework, office work)  2 - Symptomatic, <50% in bed during the day (Ambulatory and capable of all self care but unable to carry out any work activities. Up and about more than 50% of waking hours)  3 - Symptomatic, >50% in bed, but not bedbound (Capable of only limited self-care, confined to bed or chair 50% or more of waking hours)  4 - Bedbound (Completely disabled. Cannot carry on any self-care. Totally confined to bed or chair)  5 - Death   Eustace Pen MM, Creech RH, Tormey DC, et al. [phone]). "Toxicity and response criteria of the [facility] Group". [place] Oncol. 5 (6): 649-55    LABORATORY DATA:  Lab Results  Component Value Date   WBC 5.2 01/16/2017   HGB 13.9 01/16/2017   HCT 41.2 01/16/2017   MCV 84.6 01/16/2017   PLT 136 (L) 01/16/2017   Lab Results  Component Value Date   NA 139 01/03/2017   K 4.2 01/03/2017   CO2 23 01/03/2017   Lab Results  Component Value Date   ALT 16 01/03/2017   AST 22 01/03/2017   ALKPHOS 69 01/03/2017   BILITOT 1.32 (H) 01/03/2017      RADIOGRAPHY: Nm Sentinel Node Inj-no Rpt (breast)  Result Date: 02/14/2017 There is no Radiologist interpretation  for this exam.  Mm Breast Surgical Specimen  Result Date: 01/23/2017 CLINICAL DATA:  Right lumpectomy for invasive duct carcinoma. EXAM: SPECIMEN RADIOGRAPH OF THE RIGHT BREAST COMPARISON:  Previous exam(s). FINDINGS: Status post excision of the right breast. The radioactive seed, mass and ribbon shaped biopsy marker clip are present, completely intact, and were marked for pathology. IMPRESSION: Specimen radiograph of the right  breast. Electronically Signed   By: Claudie Revering M.D.   On: 01/23/2017 13:05       IMPRESSION/PLAN: 1. Stage IB, cT2N0Mx grade 1 ER/PR positive invasive ductal carcinoma of the right breast. Dr. Lisbeth Renshaw discusses the final pathology findings and reviews that since chemotherapy is not indicated, the patient's course would be followed by external radiotherapy to the breast followed by antiestrogen therapy. We discussed the risks, benefits, short, and long term effects of radiotherapy, and the patient is interested in proceeding as she is leaning toward lumpectomy. Dr. Lisbeth Renshaw discusses the delivery and logistics of radiotherapy and would anticipate a 4 week course. Written consent is obtained and placed in the chart, a copy was provided to the patient. We will touch base with her next Tuesday after she sees Dr. Barry Dienes to schedule her simulation.  If she has another aspiration of the seroma, we will delay simulation until this is resolved.   In a visit lasting 25 minutes, greater than 50% of the time was spent face to face discussing her course, and coordinating the patient's care.  The above documentation reflects my direct findings during this shared patient visit. Please see the separate note by Dr. Lisbeth Renshaw on this date for the remainder of the patient's plan of care.    Carola Rhine, PAC

## 2017-02-27 ENCOUNTER — Telehealth: Payer: Self-pay | Admitting: Radiation Oncology

## 2017-02-27 NOTE — Telephone Encounter (Addendum)
I called the patient to let her know that Dr. Barry Dienes said we could proceed with simulation and treatment.

## 2017-02-28 ENCOUNTER — Ambulatory Visit
Admission: RE | Admit: 2017-02-28 | Payer: No Typology Code available for payment source | Source: Ambulatory Visit | Admitting: Radiation Oncology

## 2017-03-02 ENCOUNTER — Ambulatory Visit: Payer: No Typology Code available for payment source | Admitting: Radiation Oncology

## 2017-03-05 ENCOUNTER — Telehealth: Payer: Self-pay | Admitting: Physical Therapy

## 2017-03-05 ENCOUNTER — Ambulatory Visit
Admission: RE | Admit: 2017-03-05 | Discharge: 2017-03-05 | Disposition: A | Discharge status: Home / Self Care | Payer: No Typology Code available for payment source | Source: Ambulatory Visit | Attending: Radiation Oncology | Admitting: Radiation Oncology

## 2017-03-05 DIAGNOSIS — C50411 Malignant neoplasm of upper-outer quadrant of right female breast: Secondary | ICD-10-CM

## 2017-03-05 DIAGNOSIS — Z510 Encounter for antineoplastic radiation therapy: Secondary | ICD-10-CM | POA: Diagnosis not present

## 2017-03-05 DIAGNOSIS — Z170 Estrogen receptor positive status [ER+]: Principal | ICD-10-CM

## 2017-03-05 NOTE — Telephone Encounter (Signed)
Spoke with Ms. [place]. We discussed arm exercises and she reported her shoulder ROM is back to normal and she feels no restrictions. We also discussed the use of a good snug fitting sports bra to further reduce breast swelling. She reported her swelling has improved and she feels she is on the right track. She does not want to return to therapy or get a compression bra due to cost. She was encouraged to contact me if she had any concerns or questions.  Annia Friendly, PT 03/05/17 10:01 AM

## 2017-03-06 NOTE — Progress Notes (Signed)
  Radiation Oncology         [phone] ________________________________  Name: SINDHU NGUYEN MRN: [id number]  Date: 03/05/2017  DOB: 11/30/1955  Optical Surface Tracking Plan:  Since intensity modulated radiotherapy (IMRT) and 3D conformal radiation treatment methods are predicated on accurate and precise positioning for treatment, intrafraction motion monitoring is medically necessary to ensure accurate and safe treatment delivery.  The ability to quantify intrafraction motion without excessive ionizing radiation dose can only be performed with optical surface tracking. Accordingly, surface imaging offers the opportunity to obtain 3D measurements of patient position throughout IMRT and 3D treatments without excessive radiation exposure.  I am ordering optical surface tracking for this patient's upcoming course of radiotherapy. ________________________________  Kyung Rudd, MD 03/06/2017 8:16 AM    Reference:   Particia Jasper, et al. Surface imaging-based analysis of intrafraction motion for breast radiotherapy patients.Journal of [place], n. 6, nov. 2014. ISSN [id number].   Available at: <http://www.jacmp.org/index.php/jacmp/article/view/4957>.

## 2017-03-06 NOTE — Progress Notes (Signed)
  Radiation Oncology         [phone] ________________________________  Name: Brenda Hartman MRN: [id number]  Date: 03/05/2017  DOB: December 25, 1955   DIAGNOSIS:     ICD-10-CM   1. Malignant neoplasm of upper-outer quadrant of right breast in female, estrogen receptor positive ([place]) C50.411    Z17.0     SIMULATION AND TREATMENT PLANNING NOTE  The patient presented for simulation prior to beginning her course of radiation treatment for her diagnosis of right-sided breast cancer. The patient was placed in a supine position on a breast board. A customized vac-lock bag was constructed and this complex treatment device will be used on a daily basis during her treatment. In this fashion, a CT scan was obtained through the chest area and an isocenter was placed near the chest wall within the breast.  The patient will be planned to receive a course of radiation initially to a dose of 42.5 Gy. This will consist of a whole breast radiotherapy technique. To accomplish this, 2 customized blocks have been designed which will correspond to medial and lateral whole breast tangent fields. This treatment will be accomplished at 2.5 Gy per fraction. A forward planning technique will also be evaluated to determine if this approach improves the plan. It is anticipated that the patient will then receive a 7.5 Gy boost to the seroma cavity which has been contoured. This will be accomplished at 2.5 Gy per fraction.   This initial treatment will consist of a 3-D conformal technique. The seroma has been contoured as the primary target structure. Additionally, dose volume histograms of both this target as well as the lungs and heart will also be evaluated. Such an approach is necessary to ensure that the target area is adequately covered while the nearby critical  normal structures are adequately spared.  Plan:  The final anticipated total dose therefore will correspond to 50  Gy.    _______________________________   Jodelle Gross, MD, PhD

## 2017-03-07 DIAGNOSIS — Z510 Encounter for antineoplastic radiation therapy: Secondary | ICD-10-CM | POA: Diagnosis not present

## 2017-03-12 ENCOUNTER — Ambulatory Visit: Payer: No Typology Code available for payment source

## 2017-03-12 ENCOUNTER — Ambulatory Visit
Admission: RE | Admit: 2017-03-12 | Discharge: 2017-03-12 | Disposition: A | Discharge status: Home / Self Care | Payer: No Typology Code available for payment source | Source: Ambulatory Visit | Attending: Radiation Oncology | Admitting: Radiation Oncology

## 2017-03-12 ENCOUNTER — Ambulatory Visit: Payer: No Typology Code available for payment source | Admitting: Radiation Oncology

## 2017-03-12 DIAGNOSIS — Z510 Encounter for antineoplastic radiation therapy: Secondary | ICD-10-CM | POA: Diagnosis not present

## 2017-03-13 ENCOUNTER — Ambulatory Visit: Payer: No Typology Code available for payment source

## 2017-03-13 ENCOUNTER — Ambulatory Visit
Admission: RE | Admit: 2017-03-13 | Discharge: 2017-03-13 | Disposition: A | Discharge status: Home / Self Care | Payer: No Typology Code available for payment source | Source: Ambulatory Visit | Attending: Radiation Oncology | Admitting: Radiation Oncology

## 2017-03-13 DIAGNOSIS — Z510 Encounter for antineoplastic radiation therapy: Secondary | ICD-10-CM | POA: Diagnosis not present

## 2017-03-13 DIAGNOSIS — C50411 Malignant neoplasm of upper-outer quadrant of right female breast: Secondary | ICD-10-CM

## 2017-03-13 DIAGNOSIS — Z170 Estrogen receptor positive status [ER+]: Principal | ICD-10-CM

## 2017-03-13 MED ORDER — ALRA NON-METALLIC DEODORANT (RAD-ONC)
1.0000 "application " | Freq: Once | TOPICAL | Status: AC
  Administered 2017-03-13: 1 via TOPICAL

## 2017-03-13 MED ORDER — RADIAPLEXRX EX GEL
Freq: Once | CUTANEOUS | Status: AC
  Administered 2017-03-13: 09:00:00 via TOPICAL

## 2017-03-13 NOTE — Progress Notes (Signed)

## 2017-03-13 NOTE — Addendum Note (Signed)
Encounter addended by: Sherrlyn Hock, LPN on: 76/70/1100 3:49 AM  Actions taken: MAR administration accepted

## 2017-03-14 ENCOUNTER — Ambulatory Visit
Admission: RE | Admit: 2017-03-14 | Discharge: 2017-03-14 | Disposition: A | Discharge status: Home / Self Care | Payer: No Typology Code available for payment source | Source: Ambulatory Visit | Attending: Radiation Oncology | Admitting: Radiation Oncology

## 2017-03-14 DIAGNOSIS — Z510 Encounter for antineoplastic radiation therapy: Secondary | ICD-10-CM | POA: Diagnosis not present

## 2017-03-15 ENCOUNTER — Ambulatory Visit
Admission: RE | Admit: 2017-03-15 | Discharge: 2017-03-15 | Disposition: A | Discharge status: Home / Self Care | Payer: No Typology Code available for payment source | Source: Ambulatory Visit | Attending: Radiation Oncology | Admitting: Radiation Oncology

## 2017-03-15 DIAGNOSIS — Z510 Encounter for antineoplastic radiation therapy: Secondary | ICD-10-CM | POA: Diagnosis not present

## 2017-03-16 ENCOUNTER — Ambulatory Visit
Admission: RE | Admit: 2017-03-16 | Discharge: 2017-03-16 | Disposition: A | Discharge status: Home / Self Care | Payer: No Typology Code available for payment source | Source: Ambulatory Visit | Attending: Radiation Oncology | Admitting: Radiation Oncology

## 2017-03-16 DIAGNOSIS — Z510 Encounter for antineoplastic radiation therapy: Secondary | ICD-10-CM | POA: Diagnosis not present

## 2017-03-19 ENCOUNTER — Ambulatory Visit
Admission: RE | Admit: 2017-03-19 | Discharge: 2017-03-19 | Disposition: A | Discharge status: Home / Self Care | Payer: No Typology Code available for payment source | Source: Ambulatory Visit | Attending: Radiation Oncology | Admitting: Radiation Oncology

## 2017-03-19 DIAGNOSIS — Z510 Encounter for antineoplastic radiation therapy: Secondary | ICD-10-CM | POA: Diagnosis not present

## 2017-03-20 ENCOUNTER — Ambulatory Visit
Admission: RE | Admit: 2017-03-20 | Discharge: 2017-03-20 | Disposition: A | Discharge status: Home / Self Care | Payer: No Typology Code available for payment source | Source: Ambulatory Visit | Attending: Radiation Oncology | Admitting: Radiation Oncology

## 2017-03-20 DIAGNOSIS — Z510 Encounter for antineoplastic radiation therapy: Secondary | ICD-10-CM | POA: Diagnosis not present

## 2017-03-21 ENCOUNTER — Ambulatory Visit
Admission: RE | Admit: 2017-03-21 | Discharge: 2017-03-21 | Disposition: A | Discharge status: Home / Self Care | Payer: No Typology Code available for payment source | Source: Ambulatory Visit | Attending: Radiation Oncology | Admitting: Radiation Oncology

## 2017-03-21 DIAGNOSIS — Z510 Encounter for antineoplastic radiation therapy: Secondary | ICD-10-CM | POA: Diagnosis not present

## 2017-03-22 ENCOUNTER — Ambulatory Visit
Admission: RE | Admit: 2017-03-22 | Discharge: 2017-03-22 | Disposition: A | Discharge status: Home / Self Care | Payer: No Typology Code available for payment source | Source: Ambulatory Visit | Attending: Radiation Oncology | Admitting: Radiation Oncology

## 2017-03-22 DIAGNOSIS — Z510 Encounter for antineoplastic radiation therapy: Secondary | ICD-10-CM | POA: Diagnosis not present

## 2017-03-23 ENCOUNTER — Ambulatory Visit
Admission: RE | Admit: 2017-03-23 | Discharge: 2017-03-23 | Disposition: A | Discharge status: Home / Self Care | Payer: No Typology Code available for payment source | Source: Ambulatory Visit | Attending: Radiation Oncology | Admitting: Radiation Oncology

## 2017-03-23 DIAGNOSIS — Z510 Encounter for antineoplastic radiation therapy: Secondary | ICD-10-CM | POA: Diagnosis not present

## 2017-03-26 ENCOUNTER — Ambulatory Visit: Payer: No Typology Code available for payment source

## 2017-03-27 ENCOUNTER — Ambulatory Visit
Admission: RE | Admit: 2017-03-27 | Discharge: 2017-03-27 | Disposition: A | Discharge status: Home / Self Care | Payer: No Typology Code available for payment source | Source: Ambulatory Visit | Attending: Radiation Oncology | Admitting: Radiation Oncology

## 2017-03-27 DIAGNOSIS — Z510 Encounter for antineoplastic radiation therapy: Secondary | ICD-10-CM | POA: Diagnosis not present

## 2017-03-28 ENCOUNTER — Ambulatory Visit
Admission: RE | Admit: 2017-03-28 | Discharge: 2017-03-28 | Disposition: A | Discharge status: Home / Self Care | Payer: No Typology Code available for payment source | Source: Ambulatory Visit | Attending: Radiation Oncology | Admitting: Radiation Oncology

## 2017-03-28 DIAGNOSIS — Z510 Encounter for antineoplastic radiation therapy: Secondary | ICD-10-CM | POA: Diagnosis not present

## 2017-03-29 ENCOUNTER — Ambulatory Visit
Admission: RE | Admit: 2017-03-29 | Discharge: 2017-03-29 | Disposition: A | Discharge status: Home / Self Care | Payer: No Typology Code available for payment source | Source: Ambulatory Visit | Attending: Radiation Oncology | Admitting: Radiation Oncology

## 2017-03-29 DIAGNOSIS — Z510 Encounter for antineoplastic radiation therapy: Secondary | ICD-10-CM | POA: Diagnosis not present

## 2017-03-30 ENCOUNTER — Ambulatory Visit
Admission: RE | Admit: 2017-03-30 | Discharge: 2017-03-30 | Disposition: A | Discharge status: Home / Self Care | Payer: No Typology Code available for payment source | Source: Ambulatory Visit | Attending: Radiation Oncology | Admitting: Radiation Oncology

## 2017-03-30 DIAGNOSIS — Z510 Encounter for antineoplastic radiation therapy: Secondary | ICD-10-CM | POA: Diagnosis not present

## 2017-03-31 ENCOUNTER — Ambulatory Visit
Admission: RE | Admit: 2017-03-31 | Discharge: 2017-03-31 | Disposition: A | Discharge status: Home / Self Care | Payer: No Typology Code available for payment source | Source: Ambulatory Visit | Attending: Radiation Oncology | Admitting: Radiation Oncology

## 2017-03-31 DIAGNOSIS — Z510 Encounter for antineoplastic radiation therapy: Secondary | ICD-10-CM | POA: Diagnosis not present

## 2017-04-02 ENCOUNTER — Ambulatory Visit
Admission: RE | Admit: 2017-04-02 | Discharge: 2017-04-02 | Disposition: A | Discharge status: Home / Self Care | Payer: No Typology Code available for payment source | Source: Ambulatory Visit | Attending: Radiation Oncology | Admitting: Radiation Oncology

## 2017-04-02 DIAGNOSIS — Z510 Encounter for antineoplastic radiation therapy: Secondary | ICD-10-CM | POA: Diagnosis not present

## 2017-04-03 ENCOUNTER — Ambulatory Visit
Admission: RE | Admit: 2017-04-03 | Discharge: 2017-04-03 | Disposition: A | Discharge status: Home / Self Care | Payer: No Typology Code available for payment source | Source: Ambulatory Visit | Attending: Radiation Oncology | Admitting: Radiation Oncology

## 2017-04-03 DIAGNOSIS — Z170 Estrogen receptor positive status [ER+]: Secondary | ICD-10-CM | POA: Diagnosis not present

## 2017-04-03 DIAGNOSIS — C50411 Malignant neoplasm of upper-outer quadrant of right female breast: Secondary | ICD-10-CM | POA: Diagnosis not present

## 2017-04-03 DIAGNOSIS — Z510 Encounter for antineoplastic radiation therapy: Secondary | ICD-10-CM | POA: Insufficient documentation

## 2017-04-04 ENCOUNTER — Ambulatory Visit: Payer: No Typology Code available for payment source

## 2017-04-04 ENCOUNTER — Ambulatory Visit
Admission: RE | Admit: 2017-04-04 | Discharge: 2017-04-04 | Disposition: A | Discharge status: Home / Self Care | Payer: No Typology Code available for payment source | Source: Ambulatory Visit | Attending: Radiation Oncology | Admitting: Radiation Oncology

## 2017-04-04 DIAGNOSIS — Z510 Encounter for antineoplastic radiation therapy: Secondary | ICD-10-CM | POA: Diagnosis not present

## 2017-04-05 ENCOUNTER — Ambulatory Visit: Payer: No Typology Code available for payment source

## 2017-04-05 ENCOUNTER — Ambulatory Visit
Admission: RE | Admit: 2017-04-05 | Discharge: 2017-04-05 | Disposition: A | Discharge status: Home / Self Care | Payer: No Typology Code available for payment source | Source: Ambulatory Visit | Attending: Radiation Oncology | Admitting: Radiation Oncology

## 2017-04-05 DIAGNOSIS — Z510 Encounter for antineoplastic radiation therapy: Secondary | ICD-10-CM | POA: Diagnosis not present

## 2017-04-06 ENCOUNTER — Encounter: Payer: Self-pay | Admitting: Radiation Oncology

## 2017-04-06 ENCOUNTER — Encounter: Payer: Self-pay | Admitting: General Practice

## 2017-04-06 ENCOUNTER — Ambulatory Visit: Payer: No Typology Code available for payment source

## 2017-04-06 ENCOUNTER — Ambulatory Visit
Admission: RE | Admit: 2017-04-06 | Discharge: 2017-04-06 | Disposition: A | Discharge status: Home / Self Care | Payer: No Typology Code available for payment source | Source: Ambulatory Visit | Attending: Radiation Oncology | Admitting: Radiation Oncology

## 2017-04-06 DIAGNOSIS — Z510 Encounter for antineoplastic radiation therapy: Secondary | ICD-10-CM | POA: Diagnosis not present

## 2017-04-06 NOTE — Progress Notes (Signed)
Advanced Directives completed, witnessed, notarized and taken to Medical Records for scanning into chart. Patient provided w copy for her records.  Edwyna Shell, LCSW Clinical Social Worker Phone:  [phone]

## 2017-04-09 ENCOUNTER — Ambulatory Visit: Payer: No Typology Code available for payment source

## 2017-04-11 ENCOUNTER — Ambulatory Visit: Payer: PRIVATE HEALTH INSURANCE | Admitting: Hematology and Oncology

## 2017-04-12 NOTE — Assessment & Plan Note (Signed)
12/22/2016: Palpable right breast mass UOQ at 9:30 position measured 2.8 cm axilla negative, ultrasound biopsy showed grade 1 IDC with DCIS, ER 100%, PR 100%, Ki-67 10%, HER-2 negative ratio 1.06, T2 N0 stage IB AJCC 8  01/21/2017;Right lumpectomy: IDC grade 1, 3 cm, DCIS with necrosis, margins negative,0/2 lymph nodes negative, ER 100%, PR 90%, Ki-67 10%, HER-2 negative ratio 1.06, T2 N0 stage IB  Oncotype Dx 0: 3% ROR Adj XRT 03/13/17- 04/06/17  Plan: Adj Anti estrogen therapy with Letrozole 2.5 mg daily

## 2017-04-13 ENCOUNTER — Ambulatory Visit (HOSPITAL_BASED_OUTPATIENT_CLINIC_OR_DEPARTMENT_OTHER): Payer: No Typology Code available for payment source | Admitting: Hematology and Oncology

## 2017-04-13 ENCOUNTER — Telehealth: Payer: Self-pay | Admitting: Hematology and Oncology

## 2017-04-13 ENCOUNTER — Encounter: Payer: Self-pay | Admitting: *Deleted

## 2017-04-13 DIAGNOSIS — Z170 Estrogen receptor positive status [ER+]: Secondary | ICD-10-CM

## 2017-04-13 DIAGNOSIS — C50411 Malignant neoplasm of upper-outer quadrant of right female breast: Secondary | ICD-10-CM

## 2017-04-13 MED ORDER — LETROZOLE 2.5 MG PO TABS: 2.5000 mg | ORAL_TABLET | Freq: Every day | ORAL | 3 refills | Status: AC

## 2017-04-13 NOTE — Progress Notes (Signed)
Patient Care Team: Fanny Bien, MD as PCP - General (Family Medicine) Stark Klein, MD as Consulting Physician (General Surgery) Nicholas Lose, MD as Consulting Physician (Hematology and Oncology) Kyung Rudd, MD as Consulting Physician (Radiation Oncology)  DIAGNOSIS:  Encounter Diagnosis  Name Primary?  . Malignant neoplasm of upper-outer quadrant of right breast in female, estrogen receptor positive ([place])     SUMMARY OF ONCOLOGIC HISTORY:   Malignant neoplasm of upper-outer quadrant of right breast in female, estrogen receptor positive ([place])   12/22/2016 Initial Diagnosis    Palpable right breast mass UOQ at 9:30 position measured 2.8 cm axilla negative, ultrasound biopsy showed grade 1 IDC with DCIS, ER 100%, PR 100%, Ki-67 10%, HER-2 negative ratio 1.06, T2 N0 stage IB AJCC 8      01/23/2017 Surgery    Right lumpectomy: IDC grade 1, 3 cm, DCIS with necrosis, margins negative,0/2 lymph nodes negative, ER 100%, PR 90%, Ki-67 10%, HER-2 negative ratio 1.06, T2 N0 stage IB      01/24/2017 Oncotype testing    Oncotype Dx 0: 3% ROR      03/13/2017 - 04/06/2017 Radiation Therapy    Adj XRT      04/12/2017 -  Anti-estrogen oral therapy    Letrozole 2.5 mg daily       CHIEF COMPLIANT: Follow-up after completion of radiation therapy  INTERVAL HISTORY: Brenda Hartman is a 61 year old with above-mentioned history of right breast cancer treated with lumpectomy and radiation which was just completed on 04/06/2017.  She is here today to discuss the Antiestrogen therapy.  She has had some radiation dermatitis but appears to be doing quite well.  She does have fatigue as well which is fairly mild related to radiation.   REVIEW OF SYSTEMS:   Constitutional: Denies fevers, chills or abnormal weight loss Eyes: Denies blurriness of vision Ears, nose, mouth, throat, and face: Denies mucositis or sore throat Respiratory: Denies cough, dyspnea or wheezes Cardiovascular: Denies  palpitation, chest discomfort Gastrointestinal:  Denies nausea, heartburn or change in bowel habits Skin: Denies abnormal skin rashes Lymphatics: Denies new lymphadenopathy or easy bruising Neurological:Denies numbness, tingling or new weaknesses Behavioral/Psych: Mood is stable, no new changes  Extremities: No lower extremity edema Breast:  Denies any pain or lumps or nodules in either breasts All other systems were reviewed with the patient and are negative.  I have reviewed the past medical history, past surgical history, social history and family history with the patient and they are unchanged from previous note.  ALLERGIES:  is allergic to morphine and related and oxycodone.  MEDICATIONS:  Current Outpatient Medications  Medication Sig Dispense Refill  . cetirizine (ZYRTEC) 5 MG tablet Take 5 mg by mouth once as needed for allergies.    . diphenhydrAMINE (BENADRYL) 25 MG tablet Take 25 mg by mouth every 6 (six) hours as needed.    Marland Kitchen letrozole (FEMARA) 2.5 MG tablet Take 1 tablet (2.5 mg total) by mouth daily. 90 tablet 3   No current facility-administered medications for this visit.     PHYSICAL EXAMINATION: ECOG PERFORMANCE STATUS: 1 - Symptomatic but completely ambulatory  Vitals:   04/13/17 1012  BP: (!) 136/94  Pulse: 78  Resp: 17  SpO2: 100%   Filed Weights   04/13/17 1012  Weight: 184 lb 9.6 oz (83.7 kg)    GENERAL:alert, no distress and comfortable SKIN: skin color, texture, turgor are normal, no rashes or significant lesions EYES: normal, Conjunctiva are pink and non-injected, sclera clear  OROPHARYNX:no exudate, no erythema and lips, buccal mucosa, and tongue normal  NECK: supple, thyroid normal size, non-tender, without nodularity LYMPH:  no palpable lymphadenopathy in the cervical, axillary or inguinal LUNGS: clear to auscultation and percussion with normal breathing effort HEART: regular rate & rhythm and no murmurs and no lower extremity  edema ABDOMEN:abdomen soft, non-tender and normal bowel sounds MUSCULOSKELETAL:no cyanosis of digits and no clubbing  NEURO: alert & oriented x 3 with fluent speech, no focal motor/sensory deficits EXTREMITIES: No lower extremity edema BREAST: Mild radiation dermatitis  LABORATORY DATA:  I have reviewed the data as listed   Chemistry      Component Value Date/Time   NA 139 01/03/2017 1142   K 4.2 01/03/2017 1142   CO2 23 01/03/2017 1142   BUN 14.5 01/03/2017 1142   CREATININE 0.8 01/03/2017 1142      Component Value Date/Time   CALCIUM 9.7 01/03/2017 1142   ALKPHOS 69 01/03/2017 1142   AST 22 01/03/2017 1142   ALT 16 01/03/2017 1142   BILITOT 1.32 (H) 01/03/2017 1142       Lab Results  Component Value Date   WBC 5.2 01/16/2017   HGB 13.9 01/16/2017   HCT 41.2 01/16/2017   MCV 84.6 01/16/2017   PLT 136 (L) 01/16/2017   NEUTROABS 1.4 (L) 01/03/2017    ASSESSMENT & PLAN:  Malignant neoplasm of upper-outer quadrant of right breast in female, estrogen receptor positive ([place]) 12/22/2016: Palpable right breast mass UOQ at 9:30 position measured 2.8 cm axilla negative, ultrasound biopsy showed grade 1 IDC with DCIS, ER 100%, PR 100%, Ki-67 10%, HER-2 negative ratio 1.06, T2 N0 stage IB AJCC 8  01/21/2017;Right lumpectomy: IDC grade 1, 3 cm, DCIS with necrosis, margins negative,0/2 lymph nodes negative, ER 100%, PR 90%, Ki-67 10%, HER-2 negative ratio 1.06, T2 N0 stage IB  Oncotype Dx 0: 3% ROR Adj XRT 03/13/17- 04/06/17  Plan: Adj Anti estrogen therapy with Letrozole 2.5 mg daily Letrozole counseling: We discussed the risks and benefits of anti-estrogen therapy with aromatase inhibitors. These include but not limited to insomnia, hot flashes, mood changes, vaginal dryness, bone density loss, and weight gain. We strongly believe that the benefits far outweigh the risks. Patient understands these risks and consented to starting treatment. Planned treatment duration is 5-7  years.  Return to clinic in 3 months for toxicity check and survivorship care plan visit  I spent 25 minutes talking to the patient of which more than half was spent in counseling and coordination of care.  No orders of the defined types were placed in this encounter.  The patient has a good understanding of the overall plan. she agrees with it. she will call with any problems that may develop before the next visit here.   Harriette Ohara, MD 04/13/17

## 2017-04-13 NOTE — Telephone Encounter (Signed)
Gave patient AVs and calendar of upcoming March 2019 appointments.

## 2017-04-25 ENCOUNTER — Encounter: Payer: Self-pay | Admitting: General Practice

## 2017-04-25 NOTE — Progress Notes (Signed)
[facility]  Clinical Social Work was referred by patient  to review and complete healthcare advance directives.  Clinical Social Worker met with patient CSW office.  The patient designated Brenda Hartman as their primary healthcare agent and no one as their secondary agent.  Patient also completed healthcare living will.    Clinical Social Worker notarized documents and made copies for patient/family. Clinical Social Worker will send documents to medical records to be scanned into patient's chart. Clinical Social Worker encouraged patient/family to contact with any additional questions or concerns.  Brenda Shell, LCSW Clinical Social Worker Phone:  [phone]

## 2017-05-03 NOTE — Progress Notes (Signed)
  Radiation Oncology         (336) [phone] ________________________________  Name: Brenda Hartman MRN: [id number]  Date: 04/06/2017  DOB: 1955-12-09  End of Treatment Note  Diagnosis:   62 y.o. female with Stage IB, cT2N0M0 grade 1 ER/PR positive invasive ductal carcinoma of the right breast    Indication for treatment:  Curative       Radiation treatment dates:   03/12/2017 - 04/06/2017  Site/dose:   The patient initially received a dose of 42.5 Gy in 17 fractions to the right breast using whole-breast tangent fields. This was delivered using a 3-D conformal technique. The patient then received a boost to the seroma. This delivered an additional 7.5 Gy in 3 fractions using a 3 field photon technique due to the depth of the seroma. The total dose was 50 Gy.  Narrative: The patient tolerated radiation treatment relatively well.   The patient had some expected skin irritation as she progressed during treatment. Moist desquamation was not present at the end of treatment.  Plan: The patient has completed radiation treatment. The patient will return to radiation oncology clinic for routine followup in one month. I advised the patient to call or return sooner if they have any questions or concerns related to their recovery or treatment. ________________________________  Jodelle Gross, MD, PhD  This document serves as a record of services personally performed by Kyung Rudd, MD. It was created on his behalf by Rae Lips, a trained medical scribe. The creation of this record is based on the scribe's personal observations and the provider's statements to them. This document has been checked and approved by the attending provider.

## 2017-05-15 ENCOUNTER — Ambulatory Visit: Payer: Self-pay | Admitting: Radiation Oncology

## 2017-07-13 ENCOUNTER — Ambulatory Visit: Payer: No Typology Code available for payment source | Admitting: Hematology and Oncology

## 2017-07-13 NOTE — Assessment & Plan Note (Deleted)
12/22/2016: Palpable right breast mass UOQ at 9:30 position measured 2.8 cm axilla negative, ultrasound biopsy showed grade 1 IDC with DCIS, ER 100%, PR 100%, Ki-67 10%, HER-2 negative ratio 1.06, T2 N0 stage IB AJCC 8  01/21/2017;Right lumpectomy: IDC grade 1, 3 cm, DCIS with necrosis, margins negative,0/2 lymph nodes negative, ER 100%, PR 90%, Ki-67 10%, HER-2 negative ratio 1.06, T2 N0 stage IB  Oncotype Dx 0: 3% ROR Adj XRT 03/13/17- 04/06/17  Current treatment: Adj Anti estrogen therapy with Letrozole 2.5 mg daily Letrozole toxicities:  Surveillance: 1. Annual mammograms in September 2. Periodic Breast exams   Return to clinic in 1 year for follow-up

## 2017-07-13 NOTE — Progress Notes (Deleted)
Patient Care Team: Fanny Bien, MD as PCP - General (Family Medicine) Stark Klein, MD as Consulting Physician (General Surgery) Nicholas Lose, MD as Consulting Physician (Hematology and Oncology) Kyung Rudd, MD as Consulting Physician (Radiation Oncology)  DIAGNOSIS:  Encounter Diagnosis  Name Primary?  . Malignant neoplasm of upper-outer quadrant of right breast in female, estrogen receptor positive ([place])     SUMMARY OF ONCOLOGIC HISTORY:   Malignant neoplasm of upper-outer quadrant of right breast in female, estrogen receptor positive ([place])   12/22/2016 Initial Diagnosis    Palpable right breast mass UOQ at 9:30 position measured 2.8 cm axilla negative, ultrasound biopsy showed grade 1 IDC with DCIS, ER 100%, PR 100%, Ki-67 10%, HER-2 negative ratio 1.06, T2 N0 stage IB AJCC 8      01/23/2017 Surgery    Right lumpectomy: IDC grade 1, 3 cm, DCIS with necrosis, margins negative,0/2 lymph nodes negative, ER 100%, PR 90%, Ki-67 10%, HER-2 negative ratio 1.06, T2 N0 stage IB      01/24/2017 Oncotype testing    Oncotype Dx 0: 3% ROR      03/13/2017 - 04/06/2017 Radiation Therapy    Adj XRT      04/12/2017 -  Anti-estrogen oral therapy    Letrozole 2.5 mg daily       CHIEF COMPLIANT: Follow-up on letrozole therapy  INTERVAL HISTORY: Brenda Hartman is a 62 year old with above-mentioned his right breast cancer treated with lumpectomy followed by radiation is currently on letrozole therapy.  She is been on it for the past 3 months.  She is here for toxicity evaluation.  REVIEW OF SYSTEMS:   Constitutional: Denies fevers, chills or abnormal weight loss Eyes: Denies blurriness of vision Ears, nose, mouth, throat, and face: Denies mucositis or sore throat Respiratory: Denies cough, dyspnea or wheezes Cardiovascular: Denies palpitation, chest discomfort Gastrointestinal:  Denies nausea, heartburn or change in bowel habits Skin: Denies abnormal skin rashes Lymphatics:  Denies new lymphadenopathy or easy bruising Neurological:Denies numbness, tingling or new weaknesses Behavioral/Psych: Mood is stable, no new changes  Extremities: No lower extremity edema Breast:  denies any pain or lumps or nodules in either breasts All other systems were reviewed with the patient and are negative.  I have reviewed the past medical history, past surgical history, social history and family history with the patient and they are unchanged from previous note.  ALLERGIES:  is allergic to morphine and related and oxycodone.  MEDICATIONS:  Current Outpatient Medications  Medication Sig Dispense Refill  . cetirizine (ZYRTEC) 5 MG tablet Take 5 mg by mouth once as needed for allergies.    . diphenhydrAMINE (BENADRYL) 25 MG tablet Take 25 mg by mouth every 6 (six) hours as needed.    Marland Kitchen letrozole (FEMARA) 2.5 MG tablet Take 1 tablet (2.5 mg total) by mouth daily. 90 tablet 3   No current facility-administered medications for this visit.     PHYSICAL EXAMINATION: ECOG PERFORMANCE STATUS: 1 - Symptomatic but completely ambulatory  There were no vitals filed for this visit. There were no vitals filed for this visit.  GENERAL:alert, no distress and comfortable SKIN: skin color, texture, turgor are normal, no rashes or significant lesions EYES: normal, Conjunctiva are pink and non-injected, sclera clear OROPHARYNX:no exudate, no erythema and lips, buccal mucosa, and tongue normal  NECK: supple, thyroid normal size, non-tender, without nodularity LYMPH:  no palpable lymphadenopathy in the cervical, axillary or inguinal LUNGS: clear to auscultation and percussion with normal breathing effort HEART: regular rate &  rhythm and no murmurs and no lower extremity edema ABDOMEN:abdomen soft, non-tender and normal bowel sounds MUSCULOSKELETAL:no cyanosis of digits and no clubbing  NEURO: alert & oriented x 3 with fluent speech, no focal motor/sensory deficits EXTREMITIES: No lower  extremity edema BREAST: No palpable masses or nodules in either right or left breasts. No palpable axillary supraclavicular or infraclavicular adenopathy no breast tenderness or nipple discharge. (exam performed in the presence of a chaperone)  LABORATORY DATA:  I have reviewed the data as listed CMP Latest Ref Rng & Units 01/03/2017  Glucose 70 - 140 mg/dl 105  BUN 7.0 - 26.0 mg/dL 14.5  Creatinine 0.6 - 1.1 mg/dL 0.8  Sodium 136 - 145 mEq/L 139  Potassium 3.5 - 5.1 mEq/L 4.2  CO2 22 - 29 mEq/L 23  Calcium 8.4 - 10.4 mg/dL 9.7  Total Protein 6.4 - 8.3 g/dL 7.3  Total Bilirubin 0.20 - 1.20 mg/dL 1.32(H)  Alkaline Phos 40 - 150 U/L 69  AST 5 - 34 U/L 22  ALT 0 - 55 U/L 16    Lab Results  Component Value Date   WBC 5.2 01/16/2017   HGB 13.9 01/16/2017   HCT 41.2 01/16/2017   MCV 84.6 01/16/2017   PLT 136 (L) 01/16/2017   NEUTROABS 1.4 (L) 01/03/2017    ASSESSMENT & PLAN:  Malignant neoplasm of upper-outer quadrant of right breast in female, estrogen receptor positive ([place]) 12/22/2016: Palpable right breast mass UOQ at 9:30 position measured 2.8 cm axilla negative, ultrasound biopsy showed grade 1 IDC with DCIS, ER 100%, PR 100%, Ki-67 10%, HER-2 negative ratio 1.06, T2 N0 stage IB AJCC 8  01/21/2017;Right lumpectomy: IDC grade 1, 3 cm, DCIS with necrosis, margins negative,0/2 lymph nodes negative, ER 100%, PR 90%, Ki-67 10%, HER-2 negative ratio 1.06, T2 N0 stage IB  Oncotype Dx 0: 3% ROR Adj XRT 03/13/17- 04/06/17  Current treatment: Adj Anti estrogen therapy with Letrozole 2.5 mg daily Letrozole toxicities:  Surveillance: 1. Annual mammograms in September 2. Periodic Breast exams   Return to clinic in 1 year for follow-up  No orders of the defined types were placed in this encounter.  The patient has a good understanding of the overall plan. she agrees with it. she will call with any problems that may develop before the next visit here.   Harriette Ohara,  MD 07/13/17

## 2019-08-23 ENCOUNTER — Ambulatory Visit: Payer: No Typology Code available for payment source | Attending: Internal Medicine

## 2019-08-23 DIAGNOSIS — Z23 Encounter for immunization: Secondary | ICD-10-CM

## 2019-08-23 NOTE — Progress Notes (Signed)
   Covid-19 Vaccination Clinic  Name:  FONDA MONCRIEF    MRN: [id number] DOB: 1955/07/08  08/23/2019  Ms. Klugh was observed post Covid-19 immunization for 15 minutes without incident. She was provided with Vaccine Information Sheet and instruction to access the V-Safe system.   Ms. Bajema was instructed to call 911 with any severe reactions post vaccine: Marland Kitchen Difficulty breathing  . Swelling of face and throat  . A fast heartbeat  . A bad rash all over body  . Dizziness and weakness   Immunizations Administered    Name Date Dose VIS Date Route   Pfizer COVID-19 Vaccine 08/23/2019  8:53 AM 0.3 mL 06/11/2018 Intramuscular   Manufacturer: [place]   Lot: [id number]   [place]: [id number]

## 2019-09-12 IMAGING — MG MM PLC BREAST LOC DEV 1ST LESION INC*R*
5 series · 5 of 5 positions shown · non-contrast
Comparison: Previous exam(s).

CLINICAL DATA: Localize mass for surgery

EXAM:
MAMMOGRAPHIC GUIDED RADIOACTIVE SEED LOCALIZATION OF THE RIGHT
BREAST

[R CC (1 of 3)]
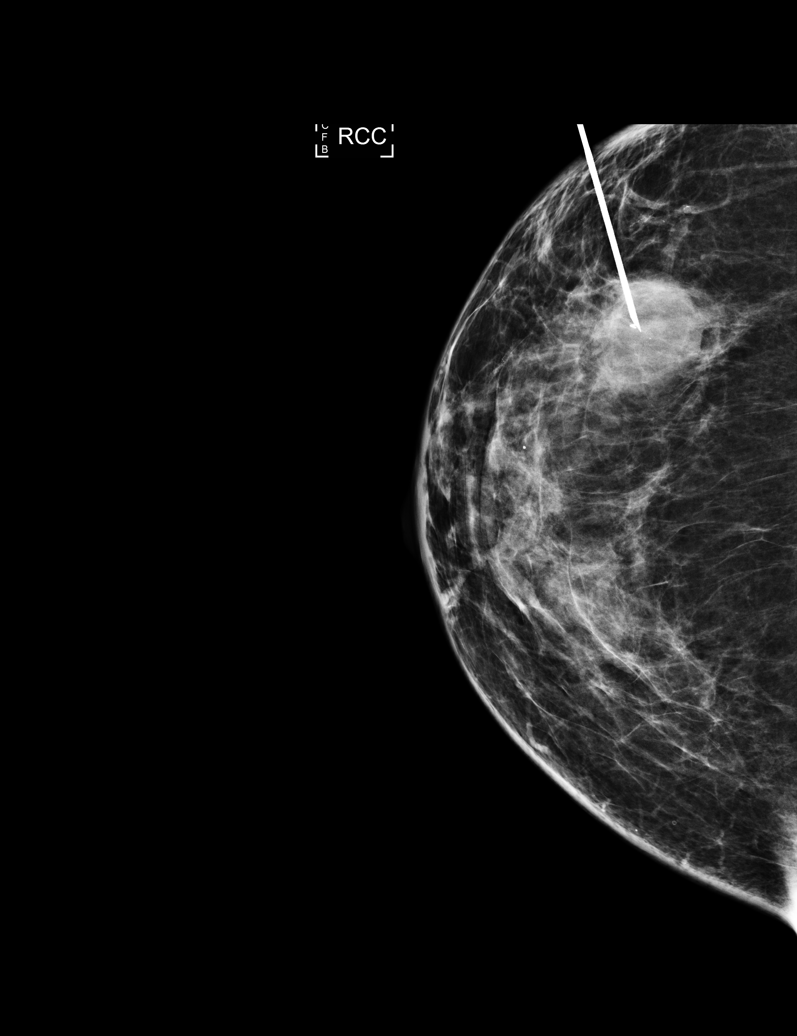

[R LM (1 of 2)]
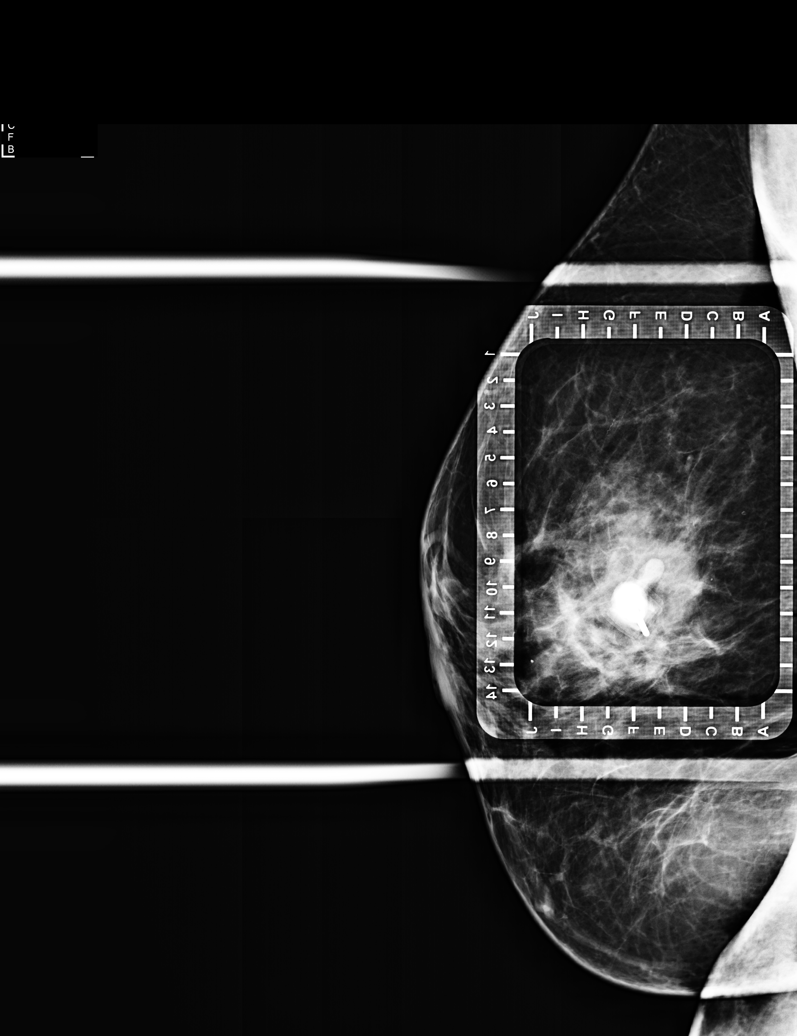

[R LM (2 of 2)]
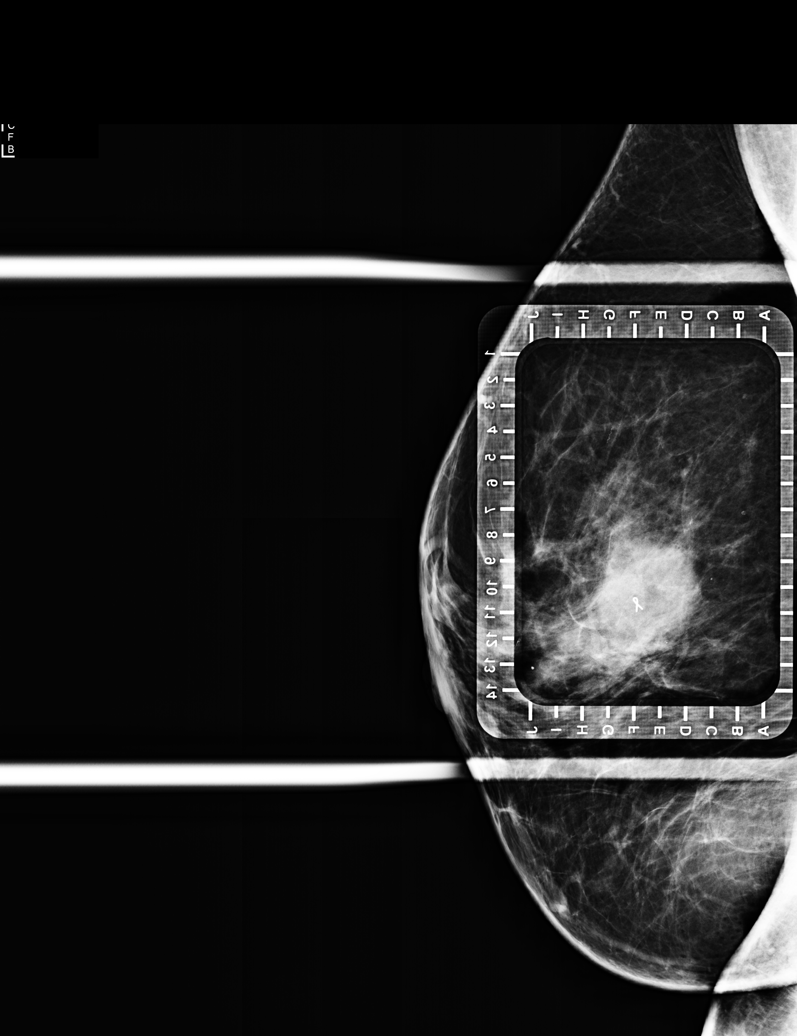

[R CC (2 of 3)]
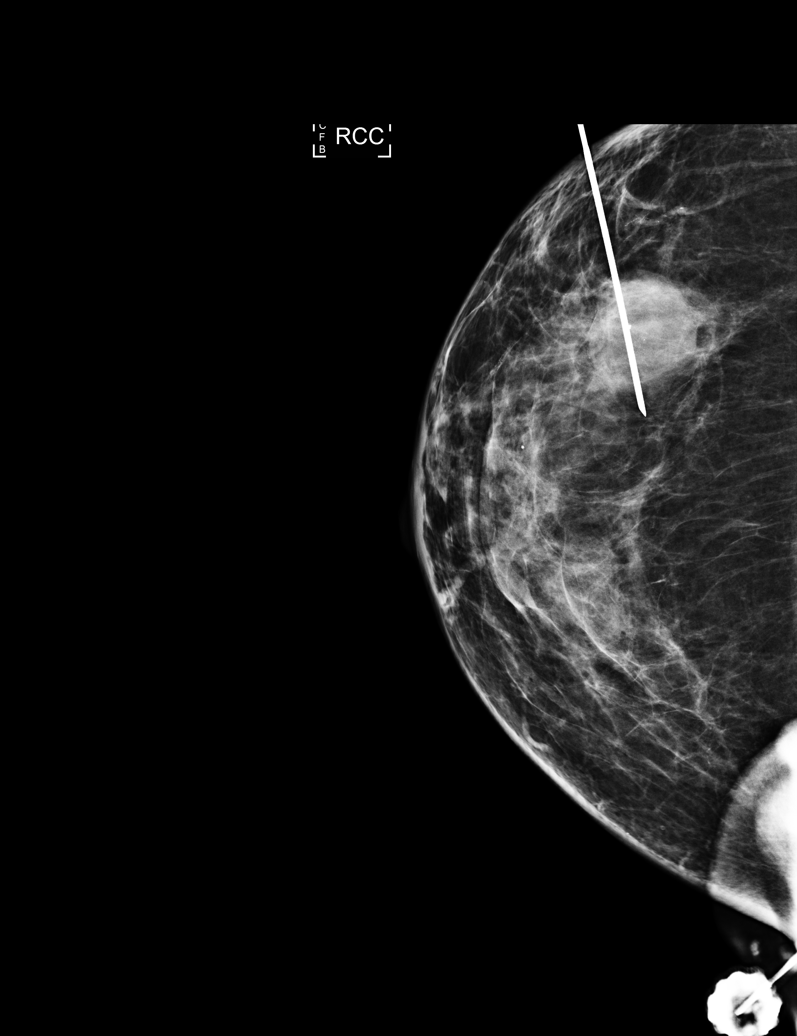

[R CC (3 of 3)]
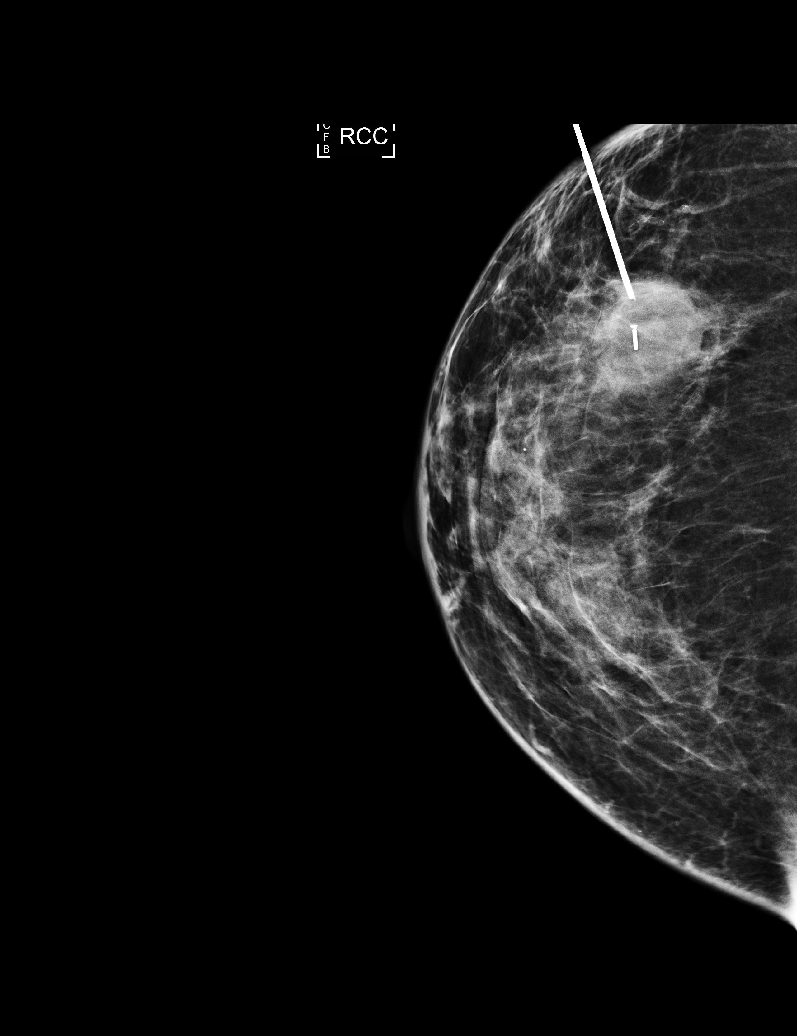

[5 of 5 positions shown; findings below may reference images not displayed]



The usual time-out protocol was performed immediately prior to the
procedure.

Using mammographic guidance, sterile technique, 1% lidocaine and an
[id number] radioactive seed, the right lateral breast mass was localized
using a lateral approach. The follow-up mammogram images confirm the
seed in the expected location and were marked for the surgeon.

Follow-up survey of the patient confirms presence of the radioactive
seed.

Order number of [id number] seed:  [id number].

Total activity:  0.250 millicuries  Reference Date: January 19, 2017

The patient tolerated the procedure well and was released from the
[REDACTED]. She was given instructions regarding seed removal.
IMPRESSION: Radioactive seed localization right breast. No apparent
complications.

## 2019-09-16 ENCOUNTER — Ambulatory Visit: Payer: No Typology Code available for payment source | Attending: Internal Medicine

## 2019-09-16 DIAGNOSIS — Z23 Encounter for immunization: Secondary | ICD-10-CM

## 2019-09-16 NOTE — Progress Notes (Signed)
   Covid-19 Vaccination Clinic  Name:  Brenda Hartman    MRN: [id number] DOB: Jun 17, 1955  09/16/2019  [name] was observed post Covid-19 immunization for 15 minutes without incident. She was provided with Vaccine Information Sheet and instruction to access the V-Safe system.   [name] was instructed to call 911 with any severe reactions post vaccine: Marland Kitchen Difficulty breathing  . Swelling of face and throat  . A fast heartbeat  . A bad rash all over body  . Dizziness and weakness   Immunizations Administered    Name Date Dose VIS Date Route   Pfizer COVID-19 Vaccine 09/16/2019  8:36 AM 0.3 mL 06/11/2018 Intramuscular   Manufacturer: [place]   Lot: [id number]   [place]: [id number]

## 2022-07-13 NOTE — Progress Notes (Signed)
The patient did not have any SDOH need at this time.

## 2022-07-19 ENCOUNTER — Encounter: Payer: Self-pay | Admitting: *Deleted

## 2022-07-19 NOTE — Progress Notes (Signed)
Pt attended 07/13/22 screening event where bp was wnl. At event, pt denied any SDOH insecurities or needs and noted her current PCP is now [employer], PA-C at [facility]. No additional health equity team support indicated at this time.

## 2022-11-04 LAB — GLUCOSE, POCT (MANUAL RESULT ENTRY): Glucose Fasting, POC: 139 mg/dL — AB (ref 70–99)

## 2022-11-04 LAB — HEMOGLOBIN A1C: Hemoglobin A1C: 5.4

## 2022-11-04 NOTE — Progress Notes (Signed)
Pt has PCP/ No changes to SDOH needs since March.

## 2022-11-20 ENCOUNTER — Encounter: Payer: Self-pay | Admitting: *Deleted

## 2022-11-20 NOTE — Progress Notes (Signed)
Pt attended 08/02/22 screening event historically, and most recently 11/04/22 screening event where her  b/p was 122/86, and her blood sugar was 139 and her A1C was 5.4. At the 11/04/22 event, pt confirmed her PCP remains Theora Gianotti PA at [facility], and pt did not identify any SDOH insecurities. Per chart review, pt saw her PCP on 11/01/22 for an office visit and had a mmg on 11/06/22. Pt has a future appt with her PCP for an annual physical on 05/01/2023. No additional health equity team support indicated at this time.
